# Patient Record
Sex: Female | Born: 1965
Health system: Southern US, Community
[De-identification: ages and names within clinical notes are randomized; demographics above are authoritative.]

## PROBLEM LIST (undated history)

## (undated) DIAGNOSIS — F5101 Primary insomnia: Secondary | ICD-10-CM

## (undated) DIAGNOSIS — M7918 Myalgia, other site: Secondary | ICD-10-CM

## (undated) DIAGNOSIS — M19041 Primary osteoarthritis, right hand: Secondary | ICD-10-CM

## (undated) DIAGNOSIS — J069 Acute upper respiratory infection, unspecified: Secondary | ICD-10-CM

## (undated) DIAGNOSIS — R5383 Other fatigue: Secondary | ICD-10-CM

## (undated) DIAGNOSIS — F339 Major depressive disorder, recurrent, unspecified: Secondary | ICD-10-CM

## (undated) DIAGNOSIS — M159 Polyosteoarthritis, unspecified: Secondary | ICD-10-CM

## (undated) DIAGNOSIS — J309 Allergic rhinitis, unspecified: Secondary | ICD-10-CM

## (undated) DIAGNOSIS — M19042 Primary osteoarthritis, left hand: Secondary | ICD-10-CM

## (undated) DIAGNOSIS — M19071 Primary osteoarthritis, right ankle and foot: Secondary | ICD-10-CM

## (undated) DIAGNOSIS — F32A Depression, unspecified: Secondary | ICD-10-CM

## (undated) DIAGNOSIS — F988 Other specified behavioral and emotional disorders with onset usually occurring in childhood and adolescence: Secondary | ICD-10-CM

## (undated) DIAGNOSIS — M1990 Unspecified osteoarthritis, unspecified site: Secondary | ICD-10-CM

## (undated) DIAGNOSIS — R112 Nausea with vomiting, unspecified: Secondary | ICD-10-CM

## (undated) DIAGNOSIS — F329 Major depressive disorder, single episode, unspecified: Secondary | ICD-10-CM

## (undated) DIAGNOSIS — M19072 Primary osteoarthritis, left ankle and foot: Secondary | ICD-10-CM

## (undated) DIAGNOSIS — F411 Generalized anxiety disorder: Secondary | ICD-10-CM

## (undated) DIAGNOSIS — G43009 Migraine without aura, not intractable, without status migrainosus: Secondary | ICD-10-CM

## (undated) HISTORY — PX: OTHER SURGICAL HISTORY: SHX169

## (undated) HISTORY — DX: Primary osteoarthritis, left hand: M19.042

## (undated) HISTORY — DX: Acute upper respiratory infection, unspecified: J06.9

## (undated) HISTORY — DX: Major depressive disorder, recurrent, unspecified: F33.9

## (undated) HISTORY — DX: Myalgia, other site: M79.18

## (undated) HISTORY — DX: Nausea with vomiting, unspecified: R11.2

## (undated) HISTORY — DX: Major depressive disorder, single episode, unspecified: F32.9

## (undated) HISTORY — DX: Generalized anxiety disorder: F41.1

## (undated) HISTORY — DX: Other fatigue: R53.83

## (undated) HISTORY — PX: DIAGNOSTIC LAPAROSCOPY: SUR761

## (undated) HISTORY — DX: Other specified behavioral and emotional disorders with onset usually occurring in childhood and adolescence: F98.8

## (undated) HISTORY — DX: Polyosteoarthritis, unspecified: M15.9

## (undated) HISTORY — DX: Allergic rhinitis, unspecified: J30.9

## (undated) HISTORY — DX: Primary osteoarthritis, right ankle and foot: M19.071

## (undated) HISTORY — DX: Migraine without aura, not intractable, without status migrainosus: G43.009

## (undated) HISTORY — DX: Primary insomnia: F51.01

## (undated) HISTORY — DX: Depression, unspecified: F32.A

## (undated) HISTORY — DX: Primary osteoarthritis, left ankle and foot: M19.072

## (undated) HISTORY — DX: Primary osteoarthritis, right hand: M19.041

---

## 1998-01-25 ENCOUNTER — Other Ambulatory Visit: Admission: RE | Admit: 1998-01-25 | Discharge: 1998-01-25 | Payer: Self-pay | Admitting: Obstetrics and Gynecology

## 1998-12-12 ENCOUNTER — Other Ambulatory Visit: Admission: RE | Admit: 1998-12-12 | Discharge: 1998-12-12 | Payer: Self-pay | Admitting: Obstetrics and Gynecology

## 1998-12-22 ENCOUNTER — Encounter: Payer: Self-pay | Admitting: Obstetrics and Gynecology

## 1998-12-22 ENCOUNTER — Ambulatory Visit (HOSPITAL_COMMUNITY): Admission: RE | Admit: 1998-12-22 | Discharge: 1998-12-22 | Payer: Self-pay | Admitting: Obstetrics and Gynecology

## 1999-11-02 ENCOUNTER — Ambulatory Visit (HOSPITAL_COMMUNITY): Admission: RE | Admit: 1999-11-02 | Discharge: 1999-11-02 | Payer: Self-pay | Admitting: *Deleted

## 1999-11-02 ENCOUNTER — Encounter: Payer: Self-pay | Admitting: *Deleted

## 1999-11-05 ENCOUNTER — Inpatient Hospital Stay: Admission: AD | Admit: 1999-11-05 | Discharge: 1999-11-05 | Payer: Self-pay | Admitting: Obstetrics

## 1999-11-05 ENCOUNTER — Encounter: Payer: Self-pay | Admitting: Obstetrics

## 1999-11-13 ENCOUNTER — Encounter (HOSPITAL_COMMUNITY): Admission: RE | Admit: 1999-11-13 | Discharge: 2000-02-11 | Payer: Self-pay | Admitting: *Deleted

## 1999-12-18 ENCOUNTER — Encounter: Payer: Self-pay | Admitting: *Deleted

## 1999-12-28 ENCOUNTER — Inpatient Hospital Stay (HOSPITAL_COMMUNITY): Admission: AD | Admit: 1999-12-28 | Discharge: 1999-12-28 | Payer: Self-pay | Admitting: *Deleted

## 2000-02-12 ENCOUNTER — Encounter (HOSPITAL_COMMUNITY): Admission: RE | Admit: 2000-02-12 | Discharge: 2000-04-21 | Payer: Self-pay | Admitting: *Deleted

## 2000-02-26 ENCOUNTER — Encounter: Payer: Self-pay | Admitting: *Deleted

## 2000-03-25 ENCOUNTER — Encounter: Payer: Self-pay | Admitting: *Deleted

## 2000-04-07 ENCOUNTER — Inpatient Hospital Stay (HOSPITAL_COMMUNITY): Admission: AD | Admit: 2000-04-07 | Discharge: 2000-04-07 | Payer: Self-pay | Admitting: *Deleted

## 2000-04-15 ENCOUNTER — Encounter: Payer: Self-pay | Admitting: *Deleted

## 2000-04-15 ENCOUNTER — Inpatient Hospital Stay (HOSPITAL_COMMUNITY): Admission: AD | Admit: 2000-04-15 | Discharge: 2000-04-15 | Payer: Self-pay | Admitting: *Deleted

## 2000-04-17 ENCOUNTER — Inpatient Hospital Stay (HOSPITAL_COMMUNITY): Admission: AD | Admit: 2000-04-17 | Discharge: 2000-04-17 | Payer: Self-pay

## 2000-04-18 ENCOUNTER — Encounter: Payer: Self-pay | Admitting: *Deleted

## 2000-04-18 ENCOUNTER — Encounter (INDEPENDENT_AMBULATORY_CARE_PROVIDER_SITE_OTHER): Payer: Self-pay | Admitting: Specialist

## 2000-04-18 ENCOUNTER — Inpatient Hospital Stay (HOSPITAL_COMMUNITY): Admission: AD | Admit: 2000-04-18 | Discharge: 2000-04-22 | Payer: Self-pay | Admitting: *Deleted

## 2000-04-19 ENCOUNTER — Encounter: Payer: Self-pay | Admitting: Obstetrics and Gynecology

## 2000-04-23 ENCOUNTER — Encounter: Admission: RE | Admit: 2000-04-23 | Discharge: 2000-05-26 | Payer: Self-pay | Admitting: *Deleted

## 2000-06-06 ENCOUNTER — Encounter (INDEPENDENT_AMBULATORY_CARE_PROVIDER_SITE_OTHER): Payer: Self-pay | Admitting: *Deleted

## 2000-06-06 ENCOUNTER — Inpatient Hospital Stay (HOSPITAL_COMMUNITY): Admission: AD | Admit: 2000-06-06 | Discharge: 2000-06-06 | Payer: Self-pay | Admitting: *Deleted

## 2002-02-01 ENCOUNTER — Other Ambulatory Visit: Admission: RE | Admit: 2002-02-01 | Discharge: 2002-02-01 | Payer: Self-pay | Admitting: Obstetrics & Gynecology

## 2002-11-03 ENCOUNTER — Ambulatory Visit (HOSPITAL_COMMUNITY): Admission: RE | Admit: 2002-11-03 | Discharge: 2002-11-03 | Payer: Self-pay | Admitting: Gastroenterology

## 2003-02-10 ENCOUNTER — Other Ambulatory Visit: Admission: RE | Admit: 2003-02-10 | Discharge: 2003-02-10 | Payer: Self-pay | Admitting: Obstetrics & Gynecology

## 2004-06-28 ENCOUNTER — Ambulatory Visit: Payer: Self-pay | Admitting: Family Medicine

## 2004-07-05 ENCOUNTER — Ambulatory Visit: Payer: Self-pay | Admitting: Family Medicine

## 2004-11-24 ENCOUNTER — Emergency Department (HOSPITAL_COMMUNITY): Admission: EM | Admit: 2004-11-24 | Discharge: 2004-11-24 | Payer: Self-pay | Admitting: Family Medicine

## 2005-06-11 ENCOUNTER — Ambulatory Visit: Payer: Self-pay | Admitting: Family Medicine

## 2005-09-05 ENCOUNTER — Ambulatory Visit: Payer: Self-pay | Admitting: Family Medicine

## 2005-09-12 ENCOUNTER — Ambulatory Visit: Payer: Self-pay | Admitting: Family Medicine

## 2005-11-18 ENCOUNTER — Encounter: Admission: RE | Admit: 2005-11-18 | Discharge: 2005-11-18 | Payer: Self-pay | Admitting: Family Medicine

## 2006-12-16 ENCOUNTER — Encounter: Admission: RE | Admit: 2006-12-16 | Discharge: 2006-12-16 | Payer: Self-pay | Admitting: Family Medicine

## 2006-12-17 DIAGNOSIS — F411 Generalized anxiety disorder: Secondary | ICD-10-CM

## 2006-12-17 DIAGNOSIS — J301 Allergic rhinitis due to pollen: Secondary | ICD-10-CM | POA: Insufficient documentation

## 2006-12-17 DIAGNOSIS — J309 Allergic rhinitis, unspecified: Secondary | ICD-10-CM | POA: Insufficient documentation

## 2006-12-17 HISTORY — DX: Allergic rhinitis, unspecified: J30.9

## 2006-12-17 HISTORY — DX: Generalized anxiety disorder: F41.1

## 2007-03-09 ENCOUNTER — Ambulatory Visit: Payer: Self-pay | Admitting: Family Medicine

## 2007-05-12 ENCOUNTER — Ambulatory Visit: Payer: Self-pay | Admitting: Family Medicine

## 2007-05-12 LAB — CONVERTED CEMR LAB
AST: 21 units/L (ref 0–37)
Albumin: 4 g/dL (ref 3.5–5.2)
Basophils Relative: 0.4 % (ref 0.0–1.0)
CO2: 30 meq/L (ref 19–32)
Chloride: 104 meq/L (ref 96–112)
Creatinine, Ser: 0.7 mg/dL (ref 0.4–1.2)
Eosinophils Relative: 3.3 % (ref 0.0–5.0)
Glucose, Bld: 89 mg/dL (ref 70–99)
Glucose, Urine, Semiquant: 100
HCT: 37.4 % (ref 36.0–46.0)
Hemoglobin: 12.6 g/dL (ref 12.0–15.0)
LDL Cholesterol: 127 mg/dL — ABNORMAL HIGH (ref 0–99)
Neutrophils Relative %: 42.3 % — ABNORMAL LOW (ref 43.0–77.0)
Protein, U semiquant: NEGATIVE
RBC: 4.15 M/uL (ref 3.87–5.11)
RDW: 11.4 % — ABNORMAL LOW (ref 11.5–14.6)
Sodium: 141 meq/L (ref 135–145)
TSH: 0.74 microintl units/mL (ref 0.35–5.50)
Total Bilirubin: 0.7 mg/dL (ref 0.3–1.2)
Total CHOL/HDL Ratio: 4.8
Total Protein: 6.7 g/dL (ref 6.0–8.3)
Urobilinogen, UA: 0.2
WBC Urine, dipstick: NEGATIVE
WBC: 4.6 10*3/uL (ref 4.5–10.5)

## 2007-05-18 ENCOUNTER — Ambulatory Visit: Payer: Self-pay | Admitting: Family Medicine

## 2007-05-18 DIAGNOSIS — F988 Other specified behavioral and emotional disorders with onset usually occurring in childhood and adolescence: Secondary | ICD-10-CM

## 2007-05-18 HISTORY — DX: Other specified behavioral and emotional disorders with onset usually occurring in childhood and adolescence: F98.8

## 2007-07-16 ENCOUNTER — Telehealth: Payer: Self-pay | Admitting: Family Medicine

## 2007-07-20 ENCOUNTER — Telehealth: Payer: Self-pay | Admitting: Family Medicine

## 2007-08-25 ENCOUNTER — Telehealth: Payer: Self-pay | Admitting: *Deleted

## 2007-12-29 ENCOUNTER — Encounter: Admission: RE | Admit: 2007-12-29 | Discharge: 2007-12-29 | Payer: Self-pay | Admitting: Family Medicine

## 2008-01-13 ENCOUNTER — Telehealth: Payer: Self-pay | Admitting: Family Medicine

## 2008-02-09 ENCOUNTER — Emergency Department (HOSPITAL_BASED_OUTPATIENT_CLINIC_OR_DEPARTMENT_OTHER): Admission: EM | Admit: 2008-02-09 | Discharge: 2008-02-09 | Payer: Self-pay | Admitting: Emergency Medicine

## 2008-02-28 DIAGNOSIS — R112 Nausea with vomiting, unspecified: Secondary | ICD-10-CM

## 2008-02-28 HISTORY — DX: Nausea with vomiting, unspecified: R11.2

## 2008-02-29 ENCOUNTER — Telehealth: Payer: Self-pay | Admitting: Family Medicine

## 2008-03-01 ENCOUNTER — Ambulatory Visit: Payer: Self-pay | Admitting: Family Medicine

## 2008-12-19 ENCOUNTER — Ambulatory Visit: Payer: Self-pay | Admitting: Family Medicine

## 2008-12-19 LAB — CONVERTED CEMR LAB
ALT: 23 units/L (ref 0–35)
AST: 21 units/L (ref 0–37)
Albumin: 4.2 g/dL (ref 3.5–5.2)
Alkaline Phosphatase: 51 units/L (ref 39–117)
BUN: 9 mg/dL (ref 6–23)
Basophils Absolute: 0 10*3/uL (ref 0.0–0.1)
Basophils Relative: 0.8 % (ref 0.0–3.0)
Bilirubin, Direct: 0 mg/dL (ref 0.0–0.3)
Blood in Urine, dipstick: NEGATIVE
CO2: 28 meq/L (ref 19–32)
Calcium: 9 mg/dL (ref 8.4–10.5)
Chloride: 108 meq/L (ref 96–112)
Cholesterol: 167 mg/dL (ref 0–200)
Creatinine, Ser: 0.6 mg/dL (ref 0.4–1.2)
Eosinophils Absolute: 0 10*3/uL (ref 0.0–0.7)
Eosinophils Relative: 1.1 % (ref 0.0–5.0)
GFR calc non Af Amer: 116.38 mL/min (ref >60)
Glucose, Bld: 92 mg/dL (ref 70–99)
HCT: 38.1 % (ref 36.0–46.0)
HDL: 49.6 mg/dL (ref >39.00)
Hemoglobin: 13 g/dL (ref 12.0–15.0)
LDL Cholesterol: 103 mg/dL — ABNORMAL HIGH (ref 0–99)
Lymphocytes Relative: 31.7 % (ref 12.0–46.0)
Lymphs Abs: 1.3 10*3/uL (ref 0.7–4.0)
MCHC: 34.1 g/dL (ref 30.0–36.0)
MCV: 94.6 fL (ref 78.0–100.0)
Monocytes Absolute: 0.4 10*3/uL (ref 0.1–1.0)
Monocytes Relative: 9.2 % (ref 3.0–12.0)
Neutro Abs: 2.5 10*3/uL (ref 1.4–7.7)
Neutrophils Relative %: 57.2 % (ref 43.0–77.0)
Nitrite: NEGATIVE
Platelets: 207 10*3/uL (ref 150.0–400.0)
Potassium: 3.6 meq/L (ref 3.5–5.1)
RBC: 4.02 M/uL (ref 3.87–5.11)
RDW: 12.1 % (ref 11.5–14.6)
Sodium: 141 meq/L (ref 135–145)
Specific Gravity, Urine: 1.025
TSH: 0.71 microintl units/mL (ref 0.35–5.50)
Total Bilirubin: 0.9 mg/dL (ref 0.3–1.2)
Total CHOL/HDL Ratio: 3
Total Protein: 7.4 g/dL (ref 6.0–8.3)
Triglycerides: 74 mg/dL (ref 0.0–149.0)
VLDL: 14.8 mg/dL (ref 0.0–40.0)
WBC Urine, dipstick: NEGATIVE
WBC: 4.2 10*3/uL — ABNORMAL LOW (ref 4.5–10.5)

## 2008-12-29 ENCOUNTER — Ambulatory Visit: Payer: Self-pay | Admitting: Family Medicine

## 2008-12-29 DIAGNOSIS — M159 Polyosteoarthritis, unspecified: Secondary | ICD-10-CM

## 2008-12-29 HISTORY — DX: Polyosteoarthritis, unspecified: M15.9

## 2010-02-16 ENCOUNTER — Telehealth: Payer: Self-pay | Admitting: Family Medicine

## 2010-03-29 ENCOUNTER — Ambulatory Visit (HOSPITAL_COMMUNITY): Payer: Self-pay | Admitting: Psychiatry

## 2010-04-17 ENCOUNTER — Encounter: Payer: Self-pay | Admitting: Family Medicine

## 2010-04-17 ENCOUNTER — Ambulatory Visit
Admission: RE | Admit: 2010-04-17 | Discharge: 2010-04-17 | Payer: Self-pay | Source: Home / Self Care | Attending: Family Medicine | Admitting: Family Medicine

## 2010-04-17 ENCOUNTER — Other Ambulatory Visit: Payer: Self-pay | Admitting: Family Medicine

## 2010-04-17 LAB — CBC WITH DIFFERENTIAL/PLATELET
Basophils Absolute: 0 10*3/uL (ref 0.0–0.1)
Basophils Relative: 0.6 % (ref 0.0–3.0)
Eosinophils Absolute: 0.2 10*3/uL (ref 0.0–0.7)
Eosinophils Relative: 4.1 % (ref 0.0–5.0)
HCT: 39.2 % (ref 36.0–46.0)
Hemoglobin: 13.3 g/dL (ref 12.0–15.0)
Lymphocytes Relative: 32.8 % (ref 12.0–46.0)
Lymphs Abs: 1.6 10*3/uL (ref 0.7–4.0)
MCHC: 34 g/dL (ref 30.0–36.0)
MCV: 94.5 fl (ref 78.0–100.0)
Monocytes Absolute: 0.4 10*3/uL (ref 0.1–1.0)
Monocytes Relative: 9.4 % (ref 3.0–12.0)
Neutro Abs: 2.5 10*3/uL (ref 1.4–7.7)
Neutrophils Relative %: 53.1 % (ref 43.0–77.0)
Platelets: 277 10*3/uL (ref 150.0–400.0)
RBC: 4.14 Mil/uL (ref 3.87–5.11)
RDW: 12.5 % (ref 11.5–14.6)
WBC: 4.8 10*3/uL (ref 4.5–10.5)

## 2010-04-17 LAB — CONVERTED CEMR LAB
Ketones, urine, test strip: NEGATIVE
Nitrite: NEGATIVE
Specific Gravity, Urine: 1.02
Urobilinogen, UA: 0.2

## 2010-04-17 LAB — BASIC METABOLIC PANEL
BUN: 12 mg/dL (ref 6–23)
CO2: 31 mEq/L (ref 19–32)
Calcium: 9.2 mg/dL (ref 8.4–10.5)
Chloride: 105 mEq/L (ref 96–112)
Creatinine, Ser: 0.5 mg/dL (ref 0.4–1.2)
GFR: 149.62 mL/min (ref >60.00)
Glucose, Bld: 90 mg/dL (ref 70–99)
Potassium: 4.2 mEq/L (ref 3.5–5.1)
Sodium: 141 mEq/L (ref 135–145)

## 2010-04-17 LAB — HEPATIC FUNCTION PANEL
ALT: 14 U/L (ref 0–35)
AST: 20 U/L (ref 0–37)
Albumin: 3.9 g/dL (ref 3.5–5.2)
Alkaline Phosphatase: 53 U/L (ref 39–117)
Bilirubin, Direct: 0.1 mg/dL (ref 0.0–0.3)
Total Bilirubin: 0.6 mg/dL (ref 0.3–1.2)
Total Protein: 7 g/dL (ref 6.0–8.3)

## 2010-04-17 LAB — LIPID PANEL
Cholesterol: 196 mg/dL (ref 0–200)
HDL: 49.3 mg/dL (ref >39.00)
LDL Cholesterol: 132 mg/dL — ABNORMAL HIGH (ref 0–99)
Total CHOL/HDL Ratio: 4
Triglycerides: 72 mg/dL (ref 0.0–149.0)
VLDL: 14.4 mg/dL (ref 0.0–40.0)

## 2010-04-17 LAB — TSH: TSH: 1.33 u[IU]/mL (ref 0.35–5.50)

## 2010-04-17 LAB — FOLATE: Folate: 6.1 ng/mL

## 2010-04-17 LAB — IRON: Iron: 64 ug/dL (ref 42–145)

## 2010-04-24 ENCOUNTER — Telehealth: Payer: Self-pay | Admitting: Family Medicine

## 2010-05-03 ENCOUNTER — Ambulatory Visit
Admission: RE | Admit: 2010-05-03 | Discharge: 2010-05-03 | Payer: Self-pay | Source: Home / Self Care | Attending: Family Medicine | Admitting: Family Medicine

## 2010-05-04 ENCOUNTER — Other Ambulatory Visit: Payer: Self-pay | Admitting: Family Medicine

## 2010-05-04 DIAGNOSIS — Z1239 Encounter for other screening for malignant neoplasm of breast: Secondary | ICD-10-CM

## 2010-05-07 ENCOUNTER — Telehealth: Payer: Self-pay | Admitting: Family Medicine

## 2010-05-08 NOTE — Progress Notes (Signed)
Summary: 30 day rx  Phone Note Call from Patient   Caller: Patient Call For: Roderick Pee MD Summary of Call: Pt will make ov asap must check work calendar first. Can pt get 30 day supply of lamotrigine 150mg  she is out. Pt is aware doc not in office until monday 02-19-10 Initial call taken by: Heron Sabins,  February 16, 2010 1:02 PM    Prescriptions: LAMICTAL 150 MG TABS (LAMOTRIGINE) once daily  #30 x 1   Entered by:   Kern Reap CMA (AAMA)   Authorized by:   Roderick Pee MD   Signed by:   Kern Reap CMA (AAMA) on 02/16/2010   Method used:   [facility]  [place] [address]       [address]       [place], [place]  [address]       Ph: [phone] or [phone]       Fax: [phone]   RxID:   [id number]

## 2010-05-10 NOTE — Progress Notes (Signed)
Summary: Pt req refill of Lamictal to last until cpx date on 05/03/10  Phone Note Refill Request Call back at [phone] cell Message from:  Patient on April 24, 2010 11:11 AM  Refills Requested: Medication #1:  LAMICTAL 150 MG TABS once daily.   Dosage confirmed as above?Dosage Confirmed Pt will run out of med before her sch cpx on 06/03/10. Pls call in refill to last until appt date.    Method [facility]ed[facility] [place] Next Appointment Scheduled: CPX on 06/03/10 Initial call taken by: Lucy Antigua,  April 24, 2010 11:12 AM    Prescriptions: LAMICTAL 150 MG TABS (LAMOTRIGINE) once daily  #30 x 1   Entered by:   Kern Reap CMA (AAMA)   Authorized by:   Roderick Pee MD   Signed by:   Kern Reap CMA (AAMA) on 04/24/2010   Method used:   [facility]        [facility]  [place] [address] [facility]       [address]       [place], [place]  [address]       Ph: [phone] or [phone]       Fax: [phone]   RxID:   [id number]

## 2010-05-10 NOTE — Assessment & Plan Note (Signed)
Summary: CPX // RS   Vital Signs:  Patient profile:   45 year old female Menstrual status:  regular Height:      66 inches Weight:      142 pounds BMI:     23.00 Temp:     98.7 degrees F oral BP sitting:   110 / 76  (left arm) Cuff size:   regular  Vitals Entered By: Kern Reap CMA Duncan Dull) (May 03, 2010 4:06 PM) CC: cpx   CC:  cpx.  History of Present Illness: Jacqueline Sloan is a 45 year old married female nonsmoker, who comes in today for general physical examination because of a history of depression on  lamictal  150 milligrams daily.  She feels depressed and wants to know, she should increase her medication.  She also has a history of ADD as been off medicine for a couple years, but now feels a problem with focus and concentration, and would like to restart her medication.  She has taken a half of a 36-mg Concerta that her twin children take for two weeks.  It made her feel much better.  She would like to continue the Concerta.  She states her twin children go to a psychiatrist because of underlying ADD and she has to come back once a month to get a prescription.  She states she does not using birth control.  She has difficulty getting pregnant.LMP every 5 weeks Pap by GYN  Review of systems otherwise negative  She also takes Midrin p.r.n. for migraines, however, as she gets older.  Her migraines become less frequent and less severe.  Allergies (verified): No Known Drug Allergies  Past History:  Past medical, surgical, family and social histories (including risk factors) reviewed, and no changes noted (except as noted below).  Past Medical History: Reviewed history from 05/18/2007 and no changes required. MHA Allergic rhinitis Anxiety ADD  Past Surgical History: CB x 1 ..twins 2001  Family History: Reviewed history from 03/09/2007 and no changes required. Family History of Arthritis Family History of Endometrial cancer Family History of Cardiovascular  disorder mother has CHF, and hypertension  Social History: Reviewed history from 03/09/2007 and no changes required. Never Smoked Alcohol use-no Married 44 year old twins  Review of Systems      See HPI  Physical Exam  General:  Well-developed,well-nourished,in no acute distress; alert,appropriate and cooperative throughout examination Head:  Normocephalic and atraumatic without obvious abnormalities. No apparent alopecia or balding. Eyes:  No corneal or conjunctival inflammation noted. EOMI. Perrla. Funduscopic exam benign, without hemorrhages, exudates or papilledema. Vision grossly normal. Ears:  External ear exam shows no significant lesions or deformities.  Otoscopic examination reveals clear canals, tympanic membranes are intact bilaterally without bulging, retraction, inflammation or discharge. Hearing is grossly normal bilaterally. Nose:  External nasal examination shows no deformity or inflammation. Nasal mucosa are pink and moist without lesions or exudates. Mouth:  Oral mucosa and oropharynx without lesions or exudates.  Teeth in good repair. Neck:  No deformities, masses, or tenderness noted. Chest Wall:  No deformities, masses, or tenderness noted. Breasts:  No mass, nodules, thickening, tenderness, bulging, retraction, inflamation, nipple discharge or skin changes noted.   Lungs:  Normal respiratory effort, chest expands symmetrically. Lungs are clear to auscultation, no crackles or wheezes. Heart:  Normal rate and regular rhythm. S1 and S2 normal without gallop, murmur, click, rub or other extra sounds. Abdomen:  Bowel sounds positive,abdomen soft and non-tender without masses, organomegaly or hernias noted. Msk:  No deformity or scoliosis noted  of thoracic or lumbar spine.   Pulses:  R and L carotid,radial,femoral,dorsalis pedis and posterior tibial pulses are full and equal bilaterally Extremities:  No clubbing, cyanosis, edema, or deformity noted with normal full range  of motion of all joints.   Neurologic:  No cranial nerve deficits noted. Station and gait are normal. Plantar reflexes are down-going bilaterally. DTRs are symmetrical throughout. Sensory, motor and coordinative functions appear intact. Skin:  Intact without suspicious lesions or rashes Cervical Nodes:  No lymphadenopathy noted Axillary Nodes:  No palpable lymphadenopathy Inguinal Nodes:  No significant adenopathy Psych:  Cognition and judgment appear intact. Alert and cooperative with normal attention span and concentration. No apparent delusions, illusions, hallucinations   Impression & Recommendations:  Problem # 1:  ADD (ICD-314.00) Assessment Deteriorated  Orders: Prescription Created Electronically [phone])  Problem # 2:  ANXIETY (ICD-300.00) Assessment: Deteriorated  Orders: Prescription Created Electronically [phone])  Problem # 3:  Preventive Health Care (ICD-V70.0) Assessment: Unchanged  Complete Medication List: 1)  Midrin 325-65-100 Mg Caps (Apap-isometheptene-dichloral) .... As needed for  migraines 2)  Lamictal Xr 300 Mg Xr24h-tab (Lamotrigine) .... Take 1 tablet by mouth every morning 3)  Concerta 18 Mg Cr-tabs (Methylphenidate hcl) .... Take 1 tablet by mouth every morning  Other Orders: Tdap => 36yrs IM ([address]782) Admin 1st Vaccine (95621)  Patient Instructions: 1)  increase the lamictal to 300 mg daily,,,,,,,,,call in 3 weeks to let us know how u  are doing. 2)  Begin Concerta 18 mg q.a.m. call in 3 weeks to let us know how u are  doing. 3)  Return in one year, sooner if any problems Prescriptions: CONCERTA 18 MG CR-TABS (METHYLPHENIDATE HCL) Take 1 tablet by mouth every morning  #30 x 0   Entered and Authorized by:   Roderick Pee MD   Signed by:   Roderick Pee MD on 05/03/2010   Method used:   Print then Give to Patient   RxID:   [id number] LAMICTAL XR 300 MG XR24H-TAB (LAMOTRIGINE) Take 1 tablet by mouth every morning  #30 x 0   Entered and  Authorized by:   Roderick Pee MD   Signed by:   Roderick Pee MD on 05/03/2010   Method used:   [facility]  [place] [address] [facility]       [address]       [place], [place]  [address]       Ph: [phone] or [phone]       Fax: [phone]   RxID:   [id number]    Orders Added: 1)  Tdap => 11yrs IM [90715] 2)  Admin 1st Vaccine [90471] 3)  Prescription Created Electronically [[id number]] 4)  Est. Patient 40-64 years [60109]   Immunizations Administered:  Tetanus Vaccine:    Vaccine Type: Tdap    Site: right deltoid    Mfr: GlaxoSmithKline    Dose: 0.5 ml    Route: IM    Given by: Kern Reap CMA (AAMA)    Exp. Date: 01/26/2012    Lot #: [id number]    VIS given: 02/24/08 version given May 03, 2010.    Physician counseled: yes   Immunizations Administered:  Tetanus Vaccine:    Vaccine Type: Tdap    Site: right deltoid    Mfr: GlaxoSmithKline    Dose: 0.5 ml    Route: IM    Given by: Kern Reap CMA (AAMA)    Exp.  Date: 01/26/2012    Lot #: [id number]    VIS given: 02/24/08 version given May 03, 2010.    Physician counseled: yes

## 2010-05-16 NOTE — Progress Notes (Signed)
Summary: zomig rx  Phone Note From Pharmacy   Summary of Call: midrin is no longer available  Follow-up for Phone Call        zomig 2.5 per dr todd Follow-up by: Kern Reap CMA Duncan Dull),  May 07, 2010 4:47 PM    New/Updated Medications: ZOMIG 2.5 MG TABS (ZOLMITRIPTAN) one tab at onset of migraine Prescriptions: ZOMIG 2.5 MG TABS (ZOLMITRIPTAN) one tab at onset of migraine  #6 x 11   Entered by:   Kern Reap CMA (AAMA)   Authorized by:   Roderick Pee MD   Signed by:   Kern Reap CMA (AAMA) on 05/07/2010   Method used:   [facility]  [place] [address] [facility]       [address]       [place], [place]  [address]       Ph: [phone] or [phone]       Fax: [phone]   RxID:   [id number]

## 2010-05-30 ENCOUNTER — Ambulatory Visit
Admission: RE | Admit: 2010-05-30 | Discharge: 2010-05-30 | Disposition: A | Discharge status: Home / Self Care | Payer: Federal, State, Local not specified - PPO | Source: Ambulatory Visit | Attending: Family Medicine | Admitting: Family Medicine

## 2010-05-30 DIAGNOSIS — Z1239 Encounter for other screening for malignant neoplasm of breast: Secondary | ICD-10-CM

## 2010-06-18 ENCOUNTER — Telehealth: Payer: Self-pay | Admitting: Family Medicine

## 2010-06-18 NOTE — Telephone Encounter (Signed)
Pt called and has lost written scripts for Concerta 18 mg. Pls call pt when ready for pick up.

## 2010-06-19 MED ORDER — METHYLPHENIDATE HCL ER (OSM) 18 MG PO TBCR: 18.0000 mg | EXTENDED_RELEASE_TABLET | ORAL | Status: DC

## 2010-06-19 NOTE — Telephone Encounter (Signed)
Please rewrite Concerta, x 3 months

## 2010-06-19 NOTE — Telephone Encounter (Signed)
Left message on machine rx ready for pick up  

## 2010-06-19 NOTE — Telephone Encounter (Signed)
Pt callback concerning status of concerta. Pt is aware rx not ready

## 2010-06-22 ENCOUNTER — Other Ambulatory Visit: Payer: Self-pay | Admitting: *Deleted

## 2010-06-22 MED ORDER — LAMOTRIGINE 150 MG PO TABS: 150.0000 mg | ORAL_TABLET | Freq: Two times a day (BID) | ORAL | Status: DC

## 2010-06-22 NOTE — Telephone Encounter (Signed)
patient  Would like generic lamictal

## 2010-08-24 NOTE — Discharge Summary (Signed)
[facility]  Patient:    ERNIE, SAGRERO                 MRN: [id number] Adm. Date:  [id number] Disc. Date: [id number] Attending:  Michaelle Copas                           Discharge Summary  HISTORY OF PRESENT ILLNESS:   A 45 year old gravida 1, para 0 at 2 2/[redacted] weeks gestation.  Patient had spontaneous rupture of membranes at 8:00 in the morning.  Patient went into spontaneous labor.  Patient conceived due to infertility assessment and treatment.  Patients labor progressed appropriately and patient delivered twin A 5 pound 11 ounce female Apgars of 7/8 spontaneously.  Twin B presented in a vertex presentation.  Fluid was ruptured.  Surgeon ______.  Vertex descended to a -1/0 station; however, there was persistent prolonged fetal bradycardia with no response to scalp stimulation and the fetal head was too high for operative vaginal delivery and patient had a STAT cesarean birth of twin B female with cord pH of 6.9.  PCO2 109.  Of interest, the fetal umbilical cord was hyperspiraled.  Placenta was sent to pathology.  Patient had appropriate postoperative course and regained normal bowel and bladder function without difficulty and was felt to be ready for discharge on April 22, 2000.  Patient is to follow-up in four to six weeks to see me. DD:  07/16/00 TD:  07/16/00 Job: [id number]

## 2010-08-24 NOTE — Op Note (Signed)
Jacqueline Sloan, Jacqueline Sloan                    ACCOUNT NO.:  [id number]   MEDICAL RECORD NO.:  [id number]                   PATIENT TYPE:  AMB   LOCATION:  ENDO                                 FACILITY:  [facility]   PHYSICIAN:  Anselmo Rod, M.D.               DATE OF BIRTH:  [birth year]   DATE OF PROCEDURE:  11/03/2002  DATE OF DISCHARGE:                                 OPERATIVE REPORT   PROCEDURE PERFORMED:  Screening colonoscopy.   ENDOSCOPIST:  Charna Elizabeth, M.D.   INSTRUMENT USED:  Olympus video colonoscope.   INDICATIONS FOR PROCEDURE:  The patient is a 45 year old white female with a  family history of colon cancer in her paternal grandfather.  History of  rectal bleeding and change in her bowel habits with loose stools for the  last four months. Rule out colonic polyps, masses, inflammatory bowel  disease, etc.   PREPROCEDURE PREPARATION:  Informed consent was procured from the patient.  The patient was fasted for eight hours prior to the procedure and prepped  with a bottle of magnesium citrate and a gallon of GoLYTELY the night prior  to the procedure.   PREPROCEDURE PHYSICAL:  The patient had stable vital signs.  Neck supple.  Chest clear to auscultation.  S1 and S2 regular.  Abdomen soft with normal  bowel sounds.   DESCRIPTION OF PROCEDURE:  The patient was placed in left lateral decubitus  position and sedated with 50 mg of Demerol and 5 mg of Versed intravenously.  Once the patient was adequately sedated and maintained on low flow oxygen  and continuous cardiac monitoring, the Olympus video colonoscope was  advanced from the rectum to the cecum and terminal ileum without difficulty.  No masses, polyps, erosions, ulcerations or diverticula were seen.  The  appendicular orifice and ileocecal valve were clearly visualized and  photographed.  The terminal ileum appeared healthy and without lesions.  Retroflexion in the rectum revealed small nonbleeding internal  hemorrhoids.  The patient tolerated the procedure well without complication.   IMPRESSION:  Normal colonoscopy up to the terminal ileum except for small  nonbleeding internal hemorrhoids seen on retroflexion in the rectum.   RECOMMENDATIONS:  1. Repeat colorectal cancer screening is recommended in the next five years     considering the patient's family history of colon cancer.  2. A high fiber diet with liberal fluid intake has been advocated.  3. The patient is to return back to the office on a p.r.n. basis if she     develops any recurrence of her symptoms in the near future.  She tells me     that her diarrhea has improved in the recent past with a high fiber diet.  Anselmo Rod, M.D.    JNM/MEDQ  D:  11/03/2002  T:  11/03/2002  Job:  [id number]   cc:   Pollyann Savoy, M.D.  [address]  [place], [place] [address]  Fax: [phone]   Eugenio Hoes. Tawanna Cooler, M.D. [facility]

## 2010-08-24 NOTE — Op Note (Signed)
[facility]  Patient:    Jacqueline Sloan, Jacqueline Sloan                 MRN: [id number] Proc. Date: 04/18/00 Adm. Date:  [id number] Disc. Date: [id number] Attending:  Antionette Char                           Operative Report  PREOPERATIVE DIAGNOSIS:         Intrauterine pregnancy at 35-2/[redacted] weeks gestation, twins with spontaneous rupture of membranes and persistent fetal bradycardia of twin B.  POSTOPERATIVE DIAGNOSES:        1. Intrauterine pregnancy at 35-2/[redacted] weeks                                    gestation, twins with spontaneous                                    rupture of membranes and persistent fetal                                    bradycardia of twin B.                                 2. Hyperspiral umbilical cord for twin B.  OPERATION:  SURGEON:                        Conni Elliot, M.D.  OPERATIVE FINDINGS:             Normal spontaneous vaginal delivery of twin A, 5 pound 11 ounce female with Apgars of 7 and 8.  Following delivery of twin A, vertex confirmed by exam and confirmed by ultrasound.  Artificial rupture of membranes clear from -1 station, well-applied.  Fluid was clear.  Point of descent was 0 station.  There was persistent prolonged fetal bradycardia with no response to the scalp stimulation.  Vertex to remained too high station to consider vacuum and we proceeded with stat low transverse cesarean delivery.  DESCRIPTION OF PROCEDURE:       After placing the patient originally under operative level of continuous lumbar epidural, the patient in the supine position, receiving oxygen, she was quickly prepped and draped in a sterile fashion.  A low transverse Pfannenstiel incision was made and carried down through the skin and subcutaneous fascia.  The pneumoperitoneum cavity was entered.  Bladder flap was created and a low transverse uterine incision was made.  The infant was delivered in vertex presentation.  Cord doubly  clamped and cut and baby handed to the neonatologist in attendance.  The placenta was delivered spontaneously.  The uterus, bladder flap, anterior peritoneum, fascia and skin closed in routine fashion.  Estimated blood loss was approximately 1000 cc.  Needle and sponge count were not done prior to procedure, therefore, an x-ray was taken postoperatively and there was found to be no retained material. DD:  04/19/00 TD:  04/19/00 Job: [id number]

## 2010-09-26 ENCOUNTER — Other Ambulatory Visit: Payer: Self-pay | Admitting: Family Medicine

## 2010-09-26 NOTE — Telephone Encounter (Signed)
Pt called req refill of Concerta 18 mg. Pt req to pick up by Friday.

## 2010-09-27 MED ORDER — METHYLPHENIDATE HCL ER (OSM) 18 MG PO TBCR: 18.0000 mg | EXTENDED_RELEASE_TABLET | Freq: Every day | ORAL | Status: DC

## 2010-09-27 NOTE — Telephone Encounter (Signed)
Left message on machine rx ready for pick up  

## 2011-01-22 ENCOUNTER — Other Ambulatory Visit: Payer: Self-pay | Admitting: Obstetrics and Gynecology

## 2011-01-22 DIAGNOSIS — N6320 Unspecified lump in the left breast, unspecified quadrant: Secondary | ICD-10-CM

## 2011-02-04 ENCOUNTER — Ambulatory Visit
Admission: RE | Admit: 2011-02-04 | Discharge: 2011-02-04 | Disposition: A | Discharge status: Home / Self Care | Payer: Federal, State, Local not specified - PPO | Source: Ambulatory Visit | Attending: Obstetrics and Gynecology | Admitting: Obstetrics and Gynecology

## 2011-02-04 DIAGNOSIS — N6320 Unspecified lump in the left breast, unspecified quadrant: Secondary | ICD-10-CM

## 2011-02-11 ENCOUNTER — Ambulatory Visit (INDEPENDENT_AMBULATORY_CARE_PROVIDER_SITE_OTHER): Payer: Federal, State, Local not specified - PPO | Admitting: Family Medicine

## 2011-02-11 ENCOUNTER — Encounter: Payer: Self-pay | Admitting: Family Medicine

## 2011-02-11 VITALS — BP 110/78 | Temp 98.8°F | Wt 142.0 lb

## 2011-02-11 DIAGNOSIS — J069 Acute upper respiratory infection, unspecified: Secondary | ICD-10-CM

## 2011-02-11 HISTORY — DX: Acute upper respiratory infection, unspecified: J06.9

## 2011-02-11 MED ORDER — HYDROCODONE-HOMATROPINE 5-1.5 MG/5ML PO SYRP: ORAL_SOLUTION | ORAL | Status: DC

## 2011-02-11 NOTE — Progress Notes (Signed)
  Subjective:    Patient ID: Jacqueline Sloan, female    DOB: 1966/04/05, 45 y.o.   MRN: [id number]  HPI J. Is a delightful, 45 year old, married female, nonsmoker, who comes in today with the viral syndrome for 10 days.  She said that condition.  Postnasal drip, fever, chills, cough, sore throat for about 10 days.  It seems to be getting better in the last 24 hours.  Review of systems otherwise negative   Review of Systems    General an infectious review of systems otherwise negative Objective:   Physical Exam Well-developed well-nourished, female in no acute distress.  Examination of the HEENT negative.  Neck was supple.  Thyroid is not enlarged.  No adenopathy.  Lungs are clear       Assessment & Plan:  Viral syndrome.  Plan treat symptomatically with Tylenol lots of liquids.  Hydromet cough syrup p.r.n.

## 2011-02-11 NOTE — Patient Instructions (Signed)
Drink lots of liquids.  Hydromet one half to 1 teaspoon at bedtime p.r.n. Cough.  Return p.r.n.

## 2011-02-15 ENCOUNTER — Other Ambulatory Visit: Payer: Self-pay | Admitting: Family Medicine

## 2011-03-21 ENCOUNTER — Telehealth: Payer: Self-pay | Admitting: Family Medicine

## 2011-03-21 MED ORDER — METHYLPHENIDATE HCL ER (OSM) 18 MG PO TBCR: 18.0000 mg | EXTENDED_RELEASE_TABLET | Freq: Every day | ORAL | Status: DC

## 2011-03-21 NOTE — Telephone Encounter (Signed)
Refill Concerta 18 mg

## 2011-03-21 NOTE — Telephone Encounter (Signed)
rx ready. Left message on machine for patient

## 2011-05-03 ENCOUNTER — Other Ambulatory Visit: Payer: Self-pay | Admitting: Family Medicine

## 2011-05-03 DIAGNOSIS — Z1231 Encounter for screening mammogram for malignant neoplasm of breast: Secondary | ICD-10-CM

## 2011-06-03 ENCOUNTER — Ambulatory Visit
Admission: RE | Admit: 2011-06-03 | Discharge: 2011-06-03 | Disposition: A | Discharge status: Home / Self Care | Payer: Federal, State, Local not specified - PPO | Source: Ambulatory Visit | Attending: Family Medicine | Admitting: Family Medicine

## 2011-06-03 DIAGNOSIS — Z1231 Encounter for screening mammogram for malignant neoplasm of breast: Secondary | ICD-10-CM

## 2011-07-04 ENCOUNTER — Telehealth: Payer: Self-pay | Admitting: Family Medicine

## 2011-07-04 MED ORDER — METHYLPHENIDATE HCL ER (OSM) 18 MG PO TBCR: 18.0000 mg | EXTENDED_RELEASE_TABLET | ORAL | Status: DC

## 2011-07-04 NOTE — Telephone Encounter (Signed)
rx ready for pick up and patient is aware  

## 2011-07-04 NOTE — Telephone Encounter (Signed)
Okay 

## 2011-07-04 NOTE — Telephone Encounter (Signed)
Okay to refill medicines she can come by today and pick up

## 2011-07-04 NOTE — Telephone Encounter (Signed)
Patient called stating that she would like a refill on her concerta which is not due until the 13th of April. Please advise.

## 2012-04-08 HISTORY — PX: TONSILECTOMY, ADENOIDECTOMY, BILATERAL MYRINGOTOMY AND TUBES: SHX2538

## 2012-06-01 ENCOUNTER — Other Ambulatory Visit: Payer: Self-pay | Admitting: Family Medicine

## 2012-06-01 DIAGNOSIS — Z1231 Encounter for screening mammogram for malignant neoplasm of breast: Secondary | ICD-10-CM

## 2012-06-15 ENCOUNTER — Ambulatory Visit: Payer: Federal, State, Local not specified - PPO

## 2012-06-19 ENCOUNTER — Ambulatory Visit
Admission: RE | Admit: 2012-06-19 | Discharge: 2012-06-19 | Disposition: A | Discharge status: Home / Self Care | Payer: Federal, State, Local not specified - PPO | Source: Ambulatory Visit | Attending: Family Medicine | Admitting: Family Medicine

## 2012-06-19 DIAGNOSIS — Z1231 Encounter for screening mammogram for malignant neoplasm of breast: Secondary | ICD-10-CM

## 2012-07-08 ENCOUNTER — Telehealth: Payer: Self-pay | Admitting: Family Medicine

## 2012-07-08 ENCOUNTER — Encounter: Payer: Self-pay | Admitting: Family Medicine

## 2012-07-08 ENCOUNTER — Ambulatory Visit (INDEPENDENT_AMBULATORY_CARE_PROVIDER_SITE_OTHER): Payer: Federal, State, Local not specified - PPO | Admitting: Family Medicine

## 2012-07-08 VITALS — BP 92/60 | HR 88 | Temp 99.1°F | Wt 146.0 lb

## 2012-07-08 DIAGNOSIS — R5381 Other malaise: Secondary | ICD-10-CM

## 2012-07-08 DIAGNOSIS — R5383 Other fatigue: Secondary | ICD-10-CM

## 2012-07-08 LAB — CBC WITH DIFFERENTIAL/PLATELET
Basophils Relative: 0.5 % (ref 0.0–3.0)
Eosinophils Relative: 5 % (ref 0.0–5.0)
HCT: 37.8 % (ref 36.0–46.0)
MCV: 92.5 fl (ref 78.0–100.0)
Monocytes Absolute: 0.4 10*3/uL (ref 0.1–1.0)
Monocytes Relative: 9.1 % (ref 3.0–12.0)
Neutrophils Relative %: 53.6 % (ref 43.0–77.0)
RBC: 4.09 Mil/uL (ref 3.87–5.11)
WBC: 3.9 10*3/uL — ABNORMAL LOW (ref 4.5–10.5)

## 2012-07-08 LAB — TSH: TSH: 1.02 u[IU]/mL (ref 0.35–5.50)

## 2012-07-08 LAB — COMPREHENSIVE METABOLIC PANEL
Albumin: 3.9 g/dL (ref 3.5–5.2)
Alkaline Phosphatase: 50 U/L (ref 39–117)
BUN: 10 mg/dL (ref 6–23)
CO2: 28 mEq/L (ref 19–32)
Calcium: 8.7 mg/dL (ref 8.4–10.5)
Chloride: 105 mEq/L (ref 96–112)
GFR: 108.22 mL/min (ref >60.00)
Glucose, Bld: 100 mg/dL — ABNORMAL HIGH (ref 70–99)
Potassium: 4.4 mEq/L (ref 3.5–5.1)

## 2012-07-08 LAB — HEMOGLOBIN A1C: Hgb A1c MFr Bld: 5.2 % (ref 4.6–6.5)

## 2012-07-08 NOTE — Telephone Encounter (Signed)
Labs look good. WBC very mildly low - our lab tends to have a lot of these. All other blood counts look great. Advise repeating at physical with PCP to ensure stable.

## 2012-07-08 NOTE — Telephone Encounter (Signed)
Left a message for pt to return call 

## 2012-07-08 NOTE — Telephone Encounter (Signed)
Called and spoke with pt and pt is aware.  

## 2012-07-08 NOTE — Progress Notes (Signed)
Chief Complaint  Patient presents with  . Foot Swelling    and sore all over x 1 month     HPI:  47 yo F pt of Dr. Barbette Or with PMH anxiety, depression, ADD, gen OA, migraine here for acute visit with complaint of feeling "like has a bug" and multiple other chronic complaints -has had sore throat, glands in neck swollen, nasal congestion and fatigue acutely and everyone at home has been fighting a bug -also has chronic pains all over the body and more in ankles - sees rheum for this and has had work up and told is OA - has appointment with Rheum to follow up on this -wants basic labs  -denies: fevers, weight loss, blood or mucus in stools, NVD  ROS: See pertinent positives and negatives per HPI.  Past Medical History  Diagnosis Date  . ADD 05/18/2007  . ALLERGIC RHINITIS 12/17/2006  . ANXIETY 12/17/2006  . GEN OSTEOARTHROSIS INVOLVING MULTIPLE SITES 12/29/2008  . Migraine     Family History  Problem Relation Age of Onset  . Heart disease Mother     HCF  . Hypertension Mother   . Arthritis Neg Hx     family hx  . Cancer Neg Hx     family hx endometrial ca    History   Social History  . Marital Status: Married    Spouse Name: N/A    Number of Children: N/A  . Years of Education: N/A   Social History Main Topics  . Smoking status: Never Smoker   . Smokeless tobacco: None  . Alcohol Use: No  . Drug Use:   . Sexually Active:    Other Topics Concern  . None   Social History Narrative  . None    Current outpatient prescriptions:lamoTRIgine (LAMICTAL) 150 MG tablet, TAKE 1 TABLET BY MOUTH TWICE A DAY, Disp: 100 tablet, Rfl: 3;  ZOLMitriptan (ZOMIG) 2.5 MG tablet, Take 2.5 mg by mouth as needed. At the on set of migraine  , Disp: , Rfl: ;  escitalopram (LEXAPRO) 20 MG tablet, , Disp: , Rfl: ;  HYDROcodone-homatropine (HYDROMET) 5-1.5 MG/5ML syrup, One half to 1 teaspoon at bedtime p.r.n. cough, Disp: 120 mL, Rfl: 2 methylphenidate (CONCERTA) 18 MG CR tablet, Take 1 tablet  (18 mg total) by mouth daily., Disp: 30 tablet, Rfl: 0;  methylphenidate (CONCERTA) 18 MG CR tablet, Take 1 tablet (18 mg total) by mouth daily., Disp: 30 tablet, Rfl: 0;  methylphenidate (CONCERTA) 18 MG CR tablet, Take 1 tablet (18 mg total) by mouth daily., Disp: 30 tablet, Rfl: 0 methylphenidate (CONCERTA) 18 MG CR tablet, Take 1 tablet (18 mg total) by mouth every morning., Disp: 30 tablet, Rfl: 0;  methylphenidate (CONCERTA) 18 MG CR tablet, Take 1 tablet (18 mg total) by mouth every morning., Disp: 30 tablet, Rfl: 0;  methylphenidate (CONCERTA) 18 MG CR tablet, Take 1 tablet (18 mg total) by mouth every morning., Disp: 30 tablet, Rfl: 0  EXAM:  Filed Vitals:   07/08/12 0804  BP: 92/60  Pulse: 88  Temp: 99.1 F (37.3 C)    Body mass index is 23.58 kg/(m^2).  GENERAL: vitals reviewed and listed above, alert, oriented, appears well hydrated and in no acute distress  HEENT: atraumatic, conjunttiva clear, no obvious abnormalities on inspection of external nose and ears, normal appearance of ear canals and TMs, clear nasal congestion, mild post oropharyngeal erythema with PND, no tonsillar edema or exudate, no sinus TTP  NECK: no obvious masses  on inspection  LUNGS: clear to auscultation bilaterally, no wheezes, rales or rhonchi, good air movement  CV: HRRR, no peripheral edema  MS: moves all extremities without noticeable abnormality  PSYCH: pleasant and cooperative, no obvious depression or anxiety  ASSESSMENT AND PLAN:  Discussed the following assessment and plan:  Fatigue - Plan: CBC with Differential, CMP, TSH, Hemoglobin A1c  -advised follow up with rheum is a good idea for her chronic pain issues -per exam URI likely viral/allergic and advised supportive care and restarting her allergy regimen -follow up physical with PCP in 1 month -basic labs drawn in anticipation of her physical per her request -Patient advised to return or notify a doctor immediately if symptoms  worsen or persist or new concerns arise.  There are no Patient Instructions on file for this visit.   Kriste Basque R.

## 2012-07-09 ENCOUNTER — Telehealth: Payer: Self-pay | Admitting: Family Medicine

## 2012-07-09 NOTE — Telephone Encounter (Signed)
Patient called stating that she would like to be referred to a Rheumatologist for the pain and swelling in her hands and feet. Please assist.

## 2012-07-10 ENCOUNTER — Other Ambulatory Visit: Payer: Self-pay | Admitting: Family Medicine

## 2012-07-10 NOTE — Telephone Encounter (Signed)
We did not discuss refilling her zomig at her appt. It appears has not been filled by her PCP in a long time and has potential serious interaction with her lexapro. I will forward to her PCP to see if he is ok with refilling.

## 2012-07-10 NOTE — Telephone Encounter (Signed)
Pls advise.  

## 2012-07-10 NOTE — Telephone Encounter (Signed)
Fleet Contras please call,,,,,,,,,, she can self refer to Dr. Kara Pacer ,,,,,, rheumatologist,,,,,, just tell her to use R. Name when she calls to make an appointment

## 2012-07-10 NOTE — Telephone Encounter (Signed)
Pt saw Dr Selena Batten yesterday. States Dr. Selena Batten was going to call in [facility] [place]. It was not done. Pt requesting rx. Please advise.

## 2012-07-16 NOTE — Telephone Encounter (Signed)
pls advise

## 2012-07-20 ENCOUNTER — Telehealth: Payer: Self-pay | Admitting: Family Medicine

## 2012-07-20 NOTE — Telephone Encounter (Signed)
This medication has been denied by Dr Tawanna Cooler.  Please have the patient schedule an appointment with Dr Tawanna Cooler for more refills.

## 2012-07-20 NOTE — Telephone Encounter (Signed)
Pt decline appt. Pt said she will callback if needed

## 2012-07-20 NOTE — Telephone Encounter (Signed)
[facility] Triage Call Report Triage Record Num: [id number] Operator: Thayer Headings Patient Name: Jacqueline Sloan Call Date & Time: 07/19/2012 11:58:19AM Patient Phone: [phone] PCP: Eugenio Hoes. Todd Patient Gender: Female PCP Fax : [phone] Patient DOB: 11-Jan-1966 [facility] Name: [facility] Reason for Call: Caller: [name]/Patient; PCP: Kriste Basque (Family Practice); CB#: [phone]; Calling today 07/19/12 regarding she was in office on 07/08/12 and said MD was supposed to call in her migraine medication but the pharmacy never received anything. She called back on 07/10/12 and requested the refill but she never heard back from office. Triage pulled chart and see following note from Kriste Basque on 07/10/12 at 2:58 PM, "We did not discuss refilling her zomig at her appt. It appears has not been filled by her PCP in a long time and has potential serious interaction with her lexapro. I will forward to her PCP to see if he is ok with refilling." No response noted from Dr. Tawanna Cooler in Chart. Triage advised pt to follow up with office on Monday with Dr. Tawanna Cooler about refilling the medication. Pt verbalized understanding. She is upset that office never followed through on calling in her medication. Protocol(s) Used: Office Note Recommended Outcome per Protocol: Information Noted and Sent to Office Reason for Outcome: Caller information to office Care Advice: ~ 04/

## 2013-08-04 ENCOUNTER — Other Ambulatory Visit: Payer: Self-pay

## 2013-08-04 DIAGNOSIS — Z1231 Encounter for screening mammogram for malignant neoplasm of breast: Secondary | ICD-10-CM

## 2013-08-20 ENCOUNTER — Ambulatory Visit
Admission: RE | Admit: 2013-08-20 | Discharge: 2013-08-20 | Disposition: A | Discharge status: Home / Self Care | Payer: Federal, State, Local not specified - PPO | Source: Ambulatory Visit

## 2013-08-20 ENCOUNTER — Encounter (INDEPENDENT_AMBULATORY_CARE_PROVIDER_SITE_OTHER): Payer: Self-pay

## 2013-08-20 DIAGNOSIS — Z1231 Encounter for screening mammogram for malignant neoplasm of breast: Secondary | ICD-10-CM

## 2015-11-06 ENCOUNTER — Encounter: Payer: Self-pay | Admitting: Rheumatology

## 2016-02-14 DIAGNOSIS — K080 Exfoliation of teeth due to systemic causes: Secondary | ICD-10-CM | POA: Diagnosis not present

## 2016-02-26 ENCOUNTER — Telehealth: Payer: Self-pay | Admitting: Radiology

## 2016-02-26 ENCOUNTER — Other Ambulatory Visit: Payer: Self-pay | Admitting: Rheumatology

## 2016-02-26 MED ORDER — MELOXICAM 15 MG PO TABS: 15.0000 mg | ORAL_TABLET | Freq: Every day | ORAL | 0 refills | Status: DC

## 2016-02-26 NOTE — Telephone Encounter (Signed)
Called patient to advise labs are due for Meloxicam  She states she has recently had labs She is correct, I was able to pull up labs in [facility] 11/06/15 WNL sent for scanning  Ok to refill per Mr  Carlyon Shadow

## 2016-02-26 NOTE — Telephone Encounter (Signed)
Needs labs for Meloxicam, no labs since 2016

## 2016-03-15 DIAGNOSIS — F3342 Major depressive disorder, recurrent, in full remission: Secondary | ICD-10-CM | POA: Diagnosis not present

## 2016-04-09 DIAGNOSIS — F3342 Major depressive disorder, recurrent, in full remission: Secondary | ICD-10-CM | POA: Diagnosis not present

## 2016-04-18 DIAGNOSIS — R5383 Other fatigue: Secondary | ICD-10-CM | POA: Insufficient documentation

## 2016-04-18 DIAGNOSIS — G43009 Migraine without aura, not intractable, without status migrainosus: Secondary | ICD-10-CM

## 2016-04-18 DIAGNOSIS — M7918 Myalgia, other site: Secondary | ICD-10-CM | POA: Insufficient documentation

## 2016-04-18 DIAGNOSIS — M19071 Primary osteoarthritis, right ankle and foot: Secondary | ICD-10-CM

## 2016-04-18 DIAGNOSIS — M19042 Primary osteoarthritis, left hand: Secondary | ICD-10-CM

## 2016-04-18 DIAGNOSIS — M19041 Primary osteoarthritis, right hand: Secondary | ICD-10-CM | POA: Insufficient documentation

## 2016-04-18 DIAGNOSIS — F5101 Primary insomnia: Secondary | ICD-10-CM | POA: Insufficient documentation

## 2016-04-18 DIAGNOSIS — M19072 Primary osteoarthritis, left ankle and foot: Secondary | ICD-10-CM

## 2016-04-18 HISTORY — DX: Primary osteoarthritis, left hand: M19.042

## 2016-04-18 HISTORY — DX: Myalgia, other site: M79.18

## 2016-04-18 HISTORY — DX: Other fatigue: R53.83

## 2016-04-18 HISTORY — DX: Primary insomnia: F51.01

## 2016-04-18 HISTORY — DX: Primary osteoarthritis, left ankle and foot: M19.072

## 2016-04-18 HISTORY — DX: Migraine without aura, not intractable, without status migrainosus: G43.009

## 2016-04-18 HISTORY — DX: Primary osteoarthritis, left ankle and foot: M19.071

## 2016-04-18 HISTORY — DX: Primary osteoarthritis, right hand: M19.041

## 2016-04-18 NOTE — Progress Notes (Deleted)
Office Visit Note  Patient: Jacqueline Sloan             Date of Birth: 1965-09-23           MRN: [id number]             PCP: Jacqueline Man, MD Referring: Jacqueline Cookey, MD Visit Date: 04/19/2016 Occupation: @GUAROCC @    Subjective:  No chief complaint on file.   History of Present Illness: Jacqueline Sloan is a 51 y.o. female ***   Activities of Daily Living:  Patient reports morning stiffness for *** {minute/hour:19697}.   Patient {ACTIONS;DENIES/REPORTS:21021675::"Denies"} nocturnal pain.  Difficulty dressing/grooming: {ACTIONS;DENIES/REPORTS:21021675::"Denies"} Difficulty climbing stairs: {ACTIONS;DENIES/REPORTS:21021675::"Denies"} Difficulty getting out of chair: {ACTIONS;DENIES/REPORTS:21021675::"Denies"} Difficulty using hands for taps, buttons, cutlery, and/or writing: {ACTIONS;DENIES/REPORTS:21021675::"Denies"}   No Rheumatology ROS completed.   PMFS History:  Patient Active Problem List   Diagnosis Date Noted  . Myofascial pain syndrome 04/18/2016  . Primary osteoarthritis of both hands 04/18/2016  . Primary osteoarthritis of both feet 04/18/2016  . History of migraine 04/18/2016  . Primary insomnia 04/18/2016  . Other fatigue 04/18/2016  . Viral URI 02/11/2011  . GEN OSTEOARTHROSIS INVOLVING MULTIPLE SITES 12/29/2008  . ADD 05/18/2007  . ANXIETY 12/17/2006  . ALLERGIC RHINITIS 12/17/2006    Past Medical History:  Diagnosis Date  . ADD 05/18/2007  . ALLERGIC RHINITIS 12/17/2006  . ANXIETY 12/17/2006  . GEN OSTEOARTHROSIS INVOLVING MULTIPLE SITES 12/29/2008  . Migraine     Family History  Problem Relation Age of Onset  . Heart disease Mother     HCF  . Hypertension Mother   . Arthritis Neg Hx     family hx  . Cancer Neg Hx     family hx endometrial ca   No past surgical history on file. Social History   Social History Narrative  . No narrative on file     Objective: Vital Signs: There were no vitals taken for this visit.   Physical  Exam   Musculoskeletal Exam: ***  CDAI Exam: No CDAI exam completed.    Investigation: Findings:  11/06/2015 CBC CMP normal  and UDS negative Narcotic agreement signed 11/06/2015  04/04/2015 X-rays of bilateral hands, 2 views, versus April 2014 shows bilateral DIP and PIP joint space narrowing.   Bilateral MCP narrowing.  Bilateral radiocarpal joint space narrowing as well as intercarpal joint space narrowing.  No erosions.  No changes versus April 2014.     X-rays of bilateral feet, 2 views, versus April 2014 shows bilateral DIP and PIP joint space narrowing.   No MCP joint space narrowing.   No erosions noted.  No change versus April 2014.      Imaging: No results found.  Speciality Comments: No specialty comments available.    Procedures:  No procedures performed Allergies: Patient has no known allergies.   Assessment / Plan:     Visit Diagnoses: Primary osteoarthritis of both hands  Primary osteoarthritis of both feet  Myofascial pain syndrome  History of migraine  Primary insomnia  Other fatigue  Anxiety and depression    Orders: No orders of the defined types were placed in this encounter.  No orders of the defined types were placed in this encounter.   Face-to-face time spent with patient was *** minutes. 50% of time was spent in counseling and coordination of care.  Follow-Up Instructions: No Follow-up on file.   Jacqueline Merino, MD  Note - This record has been created using Editor, commissioning.  Chart creation  errors have been sought, but may not always  have been located. Such creation errors do not reflect on  the standard of medical care.  

## 2016-04-19 ENCOUNTER — Ambulatory Visit: Payer: Self-pay | Admitting: Rheumatology

## 2016-04-26 DIAGNOSIS — F3342 Major depressive disorder, recurrent, in full remission: Secondary | ICD-10-CM | POA: Diagnosis not present

## 2016-05-15 ENCOUNTER — Ambulatory Visit (INDEPENDENT_AMBULATORY_CARE_PROVIDER_SITE_OTHER): Payer: Federal, State, Local not specified - PPO | Admitting: Rheumatology

## 2016-05-15 ENCOUNTER — Encounter: Payer: Self-pay | Admitting: Rheumatology

## 2016-05-15 VITALS — BP 108/69 | HR 80 | Resp 13 | Ht 66.5 in | Wt 148.0 lb

## 2016-05-15 DIAGNOSIS — M19041 Primary osteoarthritis, right hand: Secondary | ICD-10-CM

## 2016-05-15 DIAGNOSIS — R5383 Other fatigue: Secondary | ICD-10-CM

## 2016-05-15 DIAGNOSIS — M19042 Primary osteoarthritis, left hand: Secondary | ICD-10-CM | POA: Diagnosis not present

## 2016-05-15 DIAGNOSIS — M19071 Primary osteoarthritis, right ankle and foot: Secondary | ICD-10-CM | POA: Diagnosis not present

## 2016-05-15 DIAGNOSIS — M791 Myalgia: Secondary | ICD-10-CM | POA: Diagnosis not present

## 2016-05-15 DIAGNOSIS — M19072 Primary osteoarthritis, left ankle and foot: Secondary | ICD-10-CM | POA: Diagnosis not present

## 2016-05-15 DIAGNOSIS — M7918 Myalgia, other site: Secondary | ICD-10-CM

## 2016-05-15 LAB — CBC WITH DIFFERENTIAL/PLATELET
BASOS ABS: 0 {cells}/uL (ref 0–200)
Basophils Relative: 0 %
EOS PCT: 1 %
Eosinophils Absolute: 86 cells/uL (ref 15–500)
HCT: 39.9 % (ref 35.0–45.0)
HEMOGLOBIN: 13.3 g/dL (ref 11.7–15.5)
Lymphocytes Relative: 19 %
Lymphs Abs: 1634 cells/uL (ref 850–3900)
MCH: 31 pg (ref 27.0–33.0)
MCHC: 33.3 g/dL (ref 32.0–36.0)
MCV: 93 fL (ref 80.0–100.0)
MPV: 10.3 fL (ref 7.5–12.5)
Monocytes Absolute: 602 cells/uL (ref 200–950)
Monocytes Relative: 7 %
NEUTROS PCT: 73 %
Neutro Abs: 6278 cells/uL (ref 1500–7800)
PLATELETS: 328 10*3/uL (ref 140–400)
RBC: 4.29 MIL/uL (ref 3.80–5.10)
RDW: 12.9 % (ref 11.0–15.0)
WBC: 8.6 10*3/uL (ref 3.8–10.8)

## 2016-05-15 LAB — COMPLETE METABOLIC PANEL WITH GFR
ALBUMIN: 4.2 g/dL (ref 3.6–5.1)
ALK PHOS: 50 U/L (ref 33–115)
ALT: 11 U/L (ref 6–29)
AST: 16 U/L (ref 10–35)
BUN: 14 mg/dL (ref 7–25)
CHLORIDE: 105 mmol/L (ref 98–110)
CO2: 27 mmol/L (ref 20–31)
Calcium: 9.7 mg/dL (ref 8.6–10.2)
Creat: 0.72 mg/dL (ref 0.50–1.10)
GFR, Est African American: >89 mL/min (ref >=60)
GLUCOSE: 117 mg/dL — AB (ref 65–99)
POTASSIUM: 3.8 mmol/L (ref 3.5–5.3)
SODIUM: 139 mmol/L (ref 135–146)
Total Bilirubin: 0.6 mg/dL (ref 0.2–1.2)
Total Protein: 7 g/dL (ref 6.1–8.1)

## 2016-05-15 NOTE — Progress Notes (Signed)
Office Visit Note  Patient: Jacqueline Sloan             Date of Birth: 15-Oct-1965           MRN: [id number]             PCP: No primary care provider on file. Referring: Dorena Cookey, MD Visit Date: 05/15/2016 Occupation: @GUAROCC @    Subjective:  Follow-up Myofascial pain syndrome, osteoarthritis  History of Present Illness: Jacqueline Sloan is a 51 y.o. female  Last seen 11/06/2015. Patient is doing relatively well with her fibromyalgia. She's not bothered by it much at all.  Currently she is having some right neck pain. She states it is just started last night. She states that she "slept wrong" and then her pain started. She does not feel that she needs a cortisone injection at this time. I am agreeable.  She has OA of the hands and OA of the feet with ongoing discomfort to both of those areas. She does have osteoarthritis supplements but "I forget to take a most of the time". We did refer her to [facility] at one time for her neck pain and low back pain.  Patient uses tramadol regularly which "takes care of all of my pain". I advised the patient that instead of tramadol she should use OA supplements instead.  She is due for repeat lab work. CBC with differential and CMP with GFR will be done today. Patient is remote.   Activities of Daily Living:  Patient reports morning stiffness for 15 minutes.   Patient Denies nocturnal pain.  Difficulty dressing/grooming: Denies Difficulty climbing stairs: Denies Difficulty getting out of chair: Denies Difficulty using hands for taps, buttons, cutlery, and/or writing: Denies   Review of Systems  Constitutional: Negative for fatigue.  HENT: Negative for mouth sores and mouth dryness.   Eyes: Negative for dryness.  Respiratory: Negative for shortness of breath.   Gastrointestinal: Negative for constipation and diarrhea.  Musculoskeletal: Negative for myalgias and myalgias.  Skin: Negative for sensitivity to sunlight.   Psychiatric/Behavioral: Negative for decreased concentration and sleep disturbance.    PMFS History:  Patient Active Problem List   Diagnosis Date Noted  . Myofascial pain syndrome 04/18/2016  . Primary osteoarthritis of both hands 04/18/2016  . Primary osteoarthritis of both feet 04/18/2016  . History of migraine 04/18/2016  . Primary insomnia 04/18/2016  . Other fatigue 04/18/2016  . Viral URI 02/11/2011  . GEN OSTEOARTHROSIS INVOLVING MULTIPLE SITES 12/29/2008  . ADD 05/18/2007  . ANXIETY 12/17/2006  . ALLERGIC RHINITIS 12/17/2006    Past Medical History:  Diagnosis Date  . ADD 05/18/2007  . ALLERGIC RHINITIS 12/17/2006  . ANXIETY 12/17/2006  . GEN OSTEOARTHROSIS INVOLVING MULTIPLE SITES 12/29/2008  . Migraine     Family History  Problem Relation Age of Onset  . Heart disease Mother     HCF  . Hypertension Mother   . Arthritis Neg Hx     family hx  . Cancer Neg Hx     family hx endometrial ca   No past surgical history on file. Social History   Social History Narrative  . No narrative on file     Objective: Vital Signs: BP 108/69   Pulse 80   Resp 13   Ht 5' 6.5" (1.689 m)   Wt 148 lb (67.1 kg)   BMI 23.53 kg/m    Physical Exam  Constitutional: She is oriented to person, place, and time. She appears  well-developed and well-nourished.  HENT:  Head: Normocephalic and atraumatic.  Eyes: EOM are normal. Pupils are equal, round, and reactive to light.  Cardiovascular: Normal rate, regular rhythm and normal heart sounds.  Exam reveals no gallop and no friction rub.   No murmur heard. Pulmonary/Chest: Effort normal and breath sounds normal. She has no wheezes. She has no rales.  Abdominal: Soft. Bowel sounds are normal. She exhibits no distension. There is no tenderness. There is no guarding. No hernia.  Musculoskeletal: Normal range of motion. She exhibits no edema, tenderness or deformity.  Lymphadenopathy:    She has no cervical adenopathy.  Neurological:  She is alert and oriented to person, place, and time. Coordination normal.  Skin: Skin is warm and dry. Capillary refill takes less than 2 seconds. No rash noted.  Psychiatric: She has a normal mood and affect. Her behavior is normal.  Nursing note and vitals reviewed.    Musculoskeletal Exam:  Full range of motion of all joints Grip strength is equal and strong bilaterally For myalgia tender points are all absent  CDAI Exam: CDAI Homunculus Exam:   Joint Counts:  CDAI Tender Joint count: 0 CDAI Swollen Joint count: 0  Global Assessments:  Patient Global Assessment: 0 Provider Global Assessment: 0  No synovitis on examination  Investigation: No additional findings. No visits with results within 6 Month(s) from this visit.  Latest known visit with results is:  Office Visit on 07/08/2012  Component Date Value Ref Range Status  . WBC 07/08/2012 3.9* 4.5 - 10.5 K/uL Final  . RBC 07/08/2012 4.09  3.87 - 5.11 Mil/uL Final  . Hemoglobin 07/08/2012 12.8  12.0 - 15.0 g/dL Final  . HCT 07/08/2012 37.8  36.0 - 46.0 % Final  . MCV 07/08/2012 92.5  78.0 - 100.0 fl Final  . MCHC 07/08/2012 33.9  30.0 - 36.0 g/dL Final  . RDW 07/08/2012 12.9  11.5 - 14.6 % Final  . Platelets 07/08/2012 273.0  150.0 - 400.0 K/uL Final  . Neutrophils Relative % 07/08/2012 53.6  43.0 - 77.0 % Final  . Lymphocytes Relative 07/08/2012 31.8  12.0 - 46.0 % Final  . Monocytes Relative 07/08/2012 9.1  3.0 - 12.0 % Final  . Eosinophils Relative 07/08/2012 5.0  0.0 - 5.0 % Final  . Basophils Relative 07/08/2012 0.5  0.0 - 3.0 % Final  . Neutro Abs 07/08/2012 2.1  1.4 - 7.7 K/uL Final  . Lymphs Abs 07/08/2012 1.2  0.7 - 4.0 K/uL Final  . Monocytes Absolute 07/08/2012 0.4  0.1 - 1.0 K/uL Final  . Eosinophils Absolute 07/08/2012 0.2  0.0 - 0.7 K/uL Final  . Basophils Absolute 07/08/2012 0.0  0.0 - 0.1 K/uL Final  . Sodium 07/08/2012 139  135 - 145 mEq/L Final  . Potassium 07/08/2012 4.4  3.5 - 5.1 mEq/L Final  .  Chloride 07/08/2012 105  96 - 112 mEq/L Final  . CO2 07/08/2012 28  19 - 32 mEq/L Final  . Glucose, Bld 07/08/2012 100* 70 - 99 mg/dL Final  . BUN 07/08/2012 10  6 - 23 mg/dL Final  . Creatinine, Ser 07/08/2012 0.6  0.4 - 1.2 mg/dL Final  . Total Bilirubin 07/08/2012 0.8  0.3 - 1.2 mg/dL Final  . Alkaline Phosphatase 07/08/2012 50  39 - 117 U/L Final  . AST 07/08/2012 18  0 - 37 U/L Final  . ALT 07/08/2012 13  0 - 35 U/L Final  . Total Protein 07/08/2012 7.0  6.0 - 8.3 g/dL  Final  . Albumin 07/08/2012 3.9  3.5 - 5.2 g/dL Final  . Calcium 07/08/2012 8.7  8.4 - 10.5 mg/dL Final  . GFR 07/08/2012 108.22  >60.00 mL/min Final  . TSH 07/08/2012 1.02  0.35 - 5.50 uIU/mL Final  . Hgb A1c MFr Bld 07/08/2012 5.2  4.6 - 6.5 % Final   The last labs we have for patient is from July 2017 and we'll need labs today that include CBC with differential, CMP with GFR. Patient is agreeable to get them in our office  Imaging: No results found.  Speciality Comments: No specialty comments available.    Procedures:  No procedures performed Allergies: Patient has no known allergies.   Assessment / Plan:     Visit Diagnoses: Myofascial pain syndrome  Primary osteoarthritis of both hands  Primary osteoarthritis of both feet  Fatigue, unspecified type - Plan: CBC with Differential/Platelet, COMPLETE METABOLIC PANEL WITH GFR   Plan: #1: Patient is due for an updated blood work. Last blood work we have was CBC with differential, CMP with GFR, drug screen. Patient was positive for opioids.  #2: History of fibromyalgia but today she does not have any fiber myalgia tender points.  #3: OA of the hands. Ongoing discomfort.  #4: OA of the feet. Ongoing discomfort.  #5: As offered the patient OA supplement list but she forgets to take it. She states "my tramadol helps tremendously. She states that she can take up to 6 daily per our prescription. I advised the patient that we are unable to give her 6  pills per day. At the most we can do 1 or 2 pills daily at bedtime. Patient is agreeable. Return to clinic in 22mos   Orders: Orders Placed This Encounter  Procedures  . CBC with Differential/Platelet  . COMPLETE METABOLIC PANEL WITH GFR   No orders of the defined types were placed in this encounter.   Face-to-face time spent with patient was 30 minutes. 50% of time was spent in counseling and coordination of care.  Follow-Up Instructions: Return in about 6 months (around 11/12/2016) for [place], [place].   Eliezer Lofts, PA-C  Note - This record has been created using [employer].  Chart creation errors have been sought, but may not always  have been located. Such creation errors do not reflect on  the standard of medical care.

## 2016-05-21 ENCOUNTER — Other Ambulatory Visit: Payer: Self-pay | Admitting: Radiology

## 2016-05-21 DIAGNOSIS — Z79899 Other long term (current) drug therapy: Secondary | ICD-10-CM

## 2016-06-07 DIAGNOSIS — F3342 Major depressive disorder, recurrent, in full remission: Secondary | ICD-10-CM | POA: Diagnosis not present

## 2016-07-19 DIAGNOSIS — F3342 Major depressive disorder, recurrent, in full remission: Secondary | ICD-10-CM | POA: Diagnosis not present

## 2016-07-31 DIAGNOSIS — Z01419 Encounter for gynecological examination (general) (routine) without abnormal findings: Secondary | ICD-10-CM | POA: Diagnosis not present

## 2016-07-31 DIAGNOSIS — Z1231 Encounter for screening mammogram for malignant neoplasm of breast: Secondary | ICD-10-CM | POA: Diagnosis not present

## 2016-07-31 DIAGNOSIS — Z6823 Body mass index (BMI) 23.0-23.9, adult: Secondary | ICD-10-CM | POA: Diagnosis not present

## 2016-08-07 DIAGNOSIS — Z124 Encounter for screening for malignant neoplasm of cervix: Secondary | ICD-10-CM | POA: Diagnosis not present

## 2016-08-07 DIAGNOSIS — Z1151 Encounter for screening for human papillomavirus (HPV): Secondary | ICD-10-CM | POA: Diagnosis not present

## 2016-08-08 DIAGNOSIS — K6289 Other specified diseases of anus and rectum: Secondary | ICD-10-CM | POA: Diagnosis not present

## 2016-08-08 DIAGNOSIS — K645 Perianal venous thrombosis: Secondary | ICD-10-CM | POA: Diagnosis not present

## 2016-08-08 DIAGNOSIS — Z800 Family history of malignant neoplasm of digestive organs: Secondary | ICD-10-CM | POA: Diagnosis not present

## 2016-08-21 DIAGNOSIS — K080 Exfoliation of teeth due to systemic causes: Secondary | ICD-10-CM | POA: Diagnosis not present

## 2016-08-23 DIAGNOSIS — F3342 Major depressive disorder, recurrent, in full remission: Secondary | ICD-10-CM | POA: Diagnosis not present

## 2016-09-01 DIAGNOSIS — M7551 Bursitis of right shoulder: Secondary | ICD-10-CM | POA: Diagnosis not present

## 2016-10-10 ENCOUNTER — Other Ambulatory Visit: Payer: Self-pay | Admitting: Rheumatology

## 2016-10-10 DIAGNOSIS — Z5181 Encounter for therapeutic drug level monitoring: Secondary | ICD-10-CM

## 2016-10-11 NOTE — Telephone Encounter (Signed)
Last Visit: 05/15/16 Next Visit: due August 2018. Message sent to front to schedule patient Narc Agreement: 11/06/15 UDS: 10/2014  Will have patient update UDS  Okay to refill 30 day supply Tramadol?

## 2016-10-11 NOTE — Telephone Encounter (Signed)
ok 

## 2016-10-11 NOTE — Telephone Encounter (Signed)
Prescription called to the pharmacy. Patient will come next week to update UDS.

## 2016-10-16 ENCOUNTER — Other Ambulatory Visit: Payer: Self-pay | Admitting: Radiology

## 2016-10-16 DIAGNOSIS — Z79899 Other long term (current) drug therapy: Secondary | ICD-10-CM | POA: Diagnosis not present

## 2016-10-16 DIAGNOSIS — F330 Major depressive disorder, recurrent, mild: Secondary | ICD-10-CM | POA: Diagnosis not present

## 2016-10-16 DIAGNOSIS — Z5181 Encounter for therapeutic drug level monitoring: Secondary | ICD-10-CM | POA: Diagnosis not present

## 2016-10-16 LAB — CBC WITH DIFFERENTIAL/PLATELET
BASOS ABS: 57 {cells}/uL (ref 0–200)
Basophils Relative: 1 %
EOS ABS: 171 {cells}/uL (ref 15–500)
EOS PCT: 3 %
HCT: 40.1 % (ref 35.0–45.0)
Hemoglobin: 13.3 g/dL (ref 11.7–15.5)
LYMPHS PCT: 36 %
Lymphs Abs: 2052 cells/uL (ref 850–3900)
MCH: 31.3 pg (ref 27.0–33.0)
MCHC: 33.2 g/dL (ref 32.0–36.0)
MCV: 94.4 fL (ref 80.0–100.0)
MONOS PCT: 9 %
MPV: 10.2 fL (ref 7.5–12.5)
Monocytes Absolute: 513 cells/uL (ref 200–950)
NEUTROS ABS: 2907 {cells}/uL (ref 1500–7800)
Neutrophils Relative %: 51 %
PLATELETS: 283 10*3/uL (ref 140–400)
RBC: 4.25 MIL/uL (ref 3.80–5.10)
RDW: 13.5 % (ref 11.0–15.0)
WBC: 5.7 10*3/uL (ref 3.8–10.8)

## 2016-10-17 LAB — COMPLETE METABOLIC PANEL WITH GFR
ALT: 11 U/L (ref 6–29)
AST: 18 U/L (ref 10–35)
Albumin: 4.2 g/dL (ref 3.6–5.1)
Alkaline Phosphatase: 50 U/L (ref 33–130)
BILIRUBIN TOTAL: 0.4 mg/dL (ref 0.2–1.2)
BUN: 11 mg/dL (ref 7–25)
CHLORIDE: 104 mmol/L (ref 98–110)
CO2: 22 mmol/L (ref 20–31)
Calcium: 8.9 mg/dL (ref 8.6–10.4)
Creat: 0.66 mg/dL (ref 0.50–1.05)
GLUCOSE: 89 mg/dL (ref 65–99)
POTASSIUM: 4.2 mmol/L (ref 3.5–5.3)
SODIUM: 137 mmol/L (ref 135–146)
Total Protein: 6.7 g/dL (ref 6.1–8.1)

## 2016-10-23 LAB — PAIN MGMT, PROFILE 5 W/CONF, U
ALPHAHYDROXYALPRAZOLAM: NEGATIVE ng/mL (ref <25)
Alphahydroxymidazolam: NEGATIVE ng/mL (ref <50)
Alphahydroxytriazolam: NEGATIVE ng/mL (ref <50)
Aminoclonazepam: NEGATIVE ng/mL (ref <25)
Amphetamines: NEGATIVE ng/mL (ref <500)
Barbiturates: NEGATIVE ng/mL (ref <300)
Benzodiazepines: NEGATIVE ng/mL (ref <100)
COCAINE METABOLITE: NEGATIVE ng/mL (ref <150)
CREATININE: 150.5 mg/dL (ref >=20.0)
HYDROXYETHYLFLURAZEPAM: NEGATIVE ng/mL (ref <50)
LORAZEPAM: NEGATIVE ng/mL (ref <50)
MARIJUANA METABOLITE: NEGATIVE ng/mL (ref <20)
Methadone Metabolite: NEGATIVE ng/mL (ref <100)
Nordiazepam: NEGATIVE ng/mL (ref <50)
OPIATES: NEGATIVE ng/mL (ref <100)
OXAZEPAM: NEGATIVE ng/mL (ref <50)
Oxidant: NEGATIVE ug/mL (ref <200)
Oxycodone: NEGATIVE ng/mL (ref <100)
Temazepam: NEGATIVE ng/mL (ref <50)
pH: 6.42 (ref 4.5–9.0)

## 2016-10-23 NOTE — Progress Notes (Signed)
C/w

## 2016-11-08 DIAGNOSIS — F330 Major depressive disorder, recurrent, mild: Secondary | ICD-10-CM | POA: Diagnosis not present

## 2016-12-06 ENCOUNTER — Other Ambulatory Visit: Payer: Self-pay | Admitting: Rheumatology

## 2016-12-06 NOTE — Telephone Encounter (Signed)
Prescription called to the pharmacy  

## 2016-12-06 NOTE — Telephone Encounter (Signed)
ok 

## 2016-12-06 NOTE — Telephone Encounter (Signed)
Last Visit: 05/15/16 Next Visit: due August 2018. Message sent to front to schedule patient Narc Agreement: 11/06/15 UDS: 10/16/16 Last Fill: 10/11/16  Okay to refill Tramadol?

## 2017-01-01 ENCOUNTER — Other Ambulatory Visit: Payer: Self-pay | Admitting: Rheumatology

## 2017-01-01 MED ORDER — MELOXICAM 15 MG PO TABS: 15.0000 mg | ORAL_TABLET | Freq: Every day | ORAL | 0 refills | Status: DC

## 2017-01-01 NOTE — Telephone Encounter (Signed)
Patient called to request refill of mobic sent to [facility] in [place] on [address].

## 2017-01-01 NOTE — Telephone Encounter (Signed)
Last Visit: 05/15/16 Next Visit: 03/05/17 Labs: 10/16/16 WNL  Okay to refill per Dr. Estanislado Pandy

## 2017-01-17 DIAGNOSIS — F330 Major depressive disorder, recurrent, mild: Secondary | ICD-10-CM | POA: Diagnosis not present

## 2017-02-21 ENCOUNTER — Other Ambulatory Visit: Payer: Self-pay | Admitting: Rheumatology

## 2017-02-21 DIAGNOSIS — F330 Major depressive disorder, recurrent, mild: Secondary | ICD-10-CM | POA: Diagnosis not present

## 2017-02-21 NOTE — Telephone Encounter (Addendum)
Last Visit: 05/15/16 Next Visit: 03/05/17 UDS: 10/16/16 Narc Agreement: 10/2015  Okay to refill per Dr. Estanislado Pandy

## 2017-02-22 DIAGNOSIS — K121 Other forms of stomatitis: Secondary | ICD-10-CM | POA: Diagnosis not present

## 2017-02-22 DIAGNOSIS — R05 Cough: Secondary | ICD-10-CM | POA: Diagnosis not present

## 2017-02-23 NOTE — Progress Notes (Signed)
Office Visit Note  Patient: Jacqueline Sloan             Date of Birth: 07/22/1965           MRN: [id number]             PCP: Patient, No Pcp Per Referring: No ref. provider found Visit Date: 03/05/2017 Occupation: @GUAROCC @    Subjective:  Hands and feet pain    History of Present Illness: Merissa P Elizarraraz is a 51 y.o. female with a history of osteoarthritis and myofascial pain syndrome.  Patient states she has occasional pain in her hands and feet.  Denies any swelling. Patient states she has trapezius tightness and mild neck pain.  She states this has been an ongoing issue but has not worsened.  She is taking tramadol as needed. She states her pain on tramadol is 4/10 and without tramadol her pain can be a 8/10.   Activities of Daily Living:  Patient reports morning stiffness for 10 minutes.   Patient Denies nocturnal pain.  Difficulty dressing/grooming: Denies Difficulty climbing stairs: Denies Difficulty getting out of chair: Denies Difficulty using hands for taps, buttons, cutlery, and/or writing: Denies   Review of Systems  Constitutional: Negative for fatigue, night sweats, weight gain, weight loss and weakness.  HENT: Negative for mouth sores, trouble swallowing, trouble swallowing, mouth dryness and nose dryness.   Eyes: Negative for pain, redness, visual disturbance and dryness.  Respiratory: Negative for cough, shortness of breath and difficulty breathing.   Cardiovascular: Negative for chest pain, palpitations, hypertension, irregular heartbeat and swelling in legs/feet.  Gastrointestinal: Negative for blood in stool, constipation and diarrhea.  Endocrine: Negative for increased urination.  Genitourinary: Negative for vaginal dryness.  Musculoskeletal: Positive for myalgias, morning stiffness and myalgias. Negative for arthralgias, joint pain, joint swelling, muscle weakness and muscle tenderness.  Skin: Negative for color change, rash, hair loss, skin tightness,  ulcers and sensitivity to sunlight.  Allergic/Immunologic: Negative for susceptible to infections.  Neurological: Negative for dizziness, memory loss and night sweats.  Hematological: Negative for swollen glands.  Psychiatric/Behavioral: Negative for depressed mood and sleep disturbance. The patient is not nervous/anxious.     PMFS History:  Patient Active Problem List   Diagnosis Date Noted  . Myofascial pain syndrome 04/18/2016  . Primary osteoarthritis of both hands 04/18/2016  . Primary osteoarthritis of both feet 04/18/2016  . History of migraine 04/18/2016  . Primary insomnia 04/18/2016  . Other fatigue 04/18/2016  . Viral URI 02/11/2011  . GEN OSTEOARTHROSIS INVOLVING MULTIPLE SITES 12/29/2008  . ADD 05/18/2007  . ANXIETY 12/17/2006  . ALLERGIC RHINITIS 12/17/2006    Past Medical History:  Diagnosis Date  . ADD 05/18/2007  . ALLERGIC RHINITIS 12/17/2006  . ANXIETY 12/17/2006  . GEN OSTEOARTHROSIS INVOLVING MULTIPLE SITES 12/29/2008  . Migraine     Family History  Problem Relation Age of Onset  . Heart disease Mother        HCF  . Hypertension Mother   . Arthritis Neg Hx        family hx  . Cancer Neg Hx        family hx endometrial ca   Past Surgical History:  Procedure Laterality Date  . CESAREAN SECTION  2002  . TONSILECTOMY, ADENOIDECTOMY, BILATERAL MYRINGOTOMY AND TUBES  2014   Social History   Social History Narrative  . Not on file     Objective: Vital Signs: BP 106/72 (BP Location: Left Arm, Patient Position: Sitting, Cuff  Size: Normal)   Pulse 71   Resp 17   Ht 5' 6.5" (1.689 m)   Wt 147 lb (66.7 kg)   BMI 23.37 kg/m    Physical Exam  Constitutional: She is oriented to person, place, and time. She appears well-developed and well-nourished.  HENT:  Head: Normocephalic and atraumatic.  Eyes: Conjunctivae and EOM are normal.  Neck: Normal range of motion.  Cardiovascular: Normal rate, regular rhythm, normal heart sounds and intact distal  pulses.  Pulmonary/Chest: Effort normal and breath sounds normal.  Abdominal: Soft. Bowel sounds are normal.  Lymphadenopathy:    She has no cervical adenopathy.  Neurological: She is alert and oriented to person, place, and time.  Skin: Skin is warm and dry. Capillary refill takes less than 2 seconds.  Psychiatric: She has a normal mood and affect. Her behavior is normal.  Nursing note and vitals reviewed.    Musculoskeletal Exam: C-spine, thoracic, and lumbar spine good ROM.  Tendernes upon palpation of trapezius muscles.  Shoulder joints, elbow joints, and wrist joints good ROM with no synovitis. MCPs, PIPs, and DIPs good ROM.  No synovitis.  DIP synovial thickening bilaterally.  Hips joints, knee joints, and ankle joints good ROM.  MTPs, PIPs, and DIPs are good ROM with no synovitis.  No greater trochanteric bursitis.     CDAI Exam: No CDAI exam completed.    Investigation: No additional findings.UDS: 10/16/2016 Narc agreement: updated today 03/05/17 CBC Latest Ref Rng & Units 10/16/2016 05/15/2016 07/08/2012  WBC 3.8 - 10.8 K/uL 5.7 8.6 3.9(L)  Hemoglobin 11.7 - 15.5 g/dL 13.3 13.3 12.8  Hematocrit 35.0 - 45.0 % 40.1 39.9 37.8  Platelets 140 - 400 K/uL 283 328 273.0   CMP Latest Ref Rng & Units 10/16/2016 05/15/2016 07/08/2012  Glucose 65 - 99 mg/dL 89 117(H) 100(H)  BUN 7 - 25 mg/dL 11 14 10   Creatinine 0.50 - 1.05 mg/dL 0.66 0.72 0.6  Sodium 135 - 146 mmol/L 137 139 139  Potassium 3.5 - 5.3 mmol/L 4.2 3.8 4.4  Chloride 98 - 110 mmol/L 104 105 105  CO2 20 - 31 mmol/L 22 27 28   Calcium 8.6 - 10.4 mg/dL 8.9 9.7 8.7  Total Protein 6.1 - 8.1 g/dL 6.7 7.0 7.0  Total Bilirubin 0.2 - 1.2 mg/dL 0.4 0.6 0.8  Alkaline Phos 33 - 130 U/L 50 50 50  AST 10 - 35 U/L 18 16 18   ALT 6 - 29 U/L 11 11 13     Imaging: No results found.  Speciality Comments: No specialty comments available.    Procedures:  No procedures performed Allergies: Penicillins   Assessment / Plan:     Visit  Diagnoses: Myofascial pain syndrome: Patient is clinically doing well.  She has very little myalgias.  Her insomnia and fatigue have improved.    Primary osteoarthritis of both hands: Clinically stable.  She continues to take Mobic 15 mg and Tramadol 50 mg as needed.    Primary osteoarthritis of both feet: Clinically stable.  Patient wears proper fitting shoes.    Medication monitoring encounter - Patient takes Mobic 15 mg and Tramadol 50 mg as needed for pain relief.  Patient will have repeat labs in January to check CBC and CMP.  Patient's UDS is up to date (10/16/16) and narcotic agreement was filled out today (03/05/17).    Plan: COMPLETE METABOLIC PANEL WITH GFR, CBC with Differential/Platelet  Primary insomnia: Improved   Other fatigue: improved   History of migraine: Patient denies having  any recent migraines.  She states she has had mild headaches in the mornings lately, but attributes it to increasing her dose of Lamictal.  She is going to follow up with her prescribing physician.    History of depression: Medically managed   History of anxiety: Medically managed    Orders: Orders Placed This Encounter  Procedures  . COMPLETE METABOLIC PANEL WITH GFR  . CBC with Differential/Platelet   No orders of the defined types were placed in this encounter.   Follow-Up Instructions: Return in about 6 months (around 09/02/2017) for Osteoarthritis.   Bo Merino, MD  Note - This record has been created using Editor, commissioning.  Chart creation errors have been sought, but may not always  have been located. Such creation errors do not reflect on  the standard of medical care.

## 2017-02-26 DIAGNOSIS — K080 Exfoliation of teeth due to systemic causes: Secondary | ICD-10-CM | POA: Diagnosis not present

## 2017-03-05 ENCOUNTER — Ambulatory Visit: Payer: Federal, State, Local not specified - PPO | Admitting: Rheumatology

## 2017-03-05 ENCOUNTER — Encounter: Payer: Self-pay | Admitting: Rheumatology

## 2017-03-05 VITALS — BP 106/72 | HR 71 | Resp 17 | Ht 66.5 in | Wt 147.0 lb

## 2017-03-05 DIAGNOSIS — R5383 Other fatigue: Secondary | ICD-10-CM | POA: Diagnosis not present

## 2017-03-05 DIAGNOSIS — Z5181 Encounter for therapeutic drug level monitoring: Secondary | ICD-10-CM | POA: Diagnosis not present

## 2017-03-05 DIAGNOSIS — M19041 Primary osteoarthritis, right hand: Secondary | ICD-10-CM

## 2017-03-05 DIAGNOSIS — F5101 Primary insomnia: Secondary | ICD-10-CM

## 2017-03-05 DIAGNOSIS — M19072 Primary osteoarthritis, left ankle and foot: Secondary | ICD-10-CM | POA: Diagnosis not present

## 2017-03-05 DIAGNOSIS — M7918 Myalgia, other site: Secondary | ICD-10-CM | POA: Diagnosis not present

## 2017-03-05 DIAGNOSIS — Z8659 Personal history of other mental and behavioral disorders: Secondary | ICD-10-CM | POA: Diagnosis not present

## 2017-03-05 DIAGNOSIS — Z8669 Personal history of other diseases of the nervous system and sense organs: Secondary | ICD-10-CM | POA: Diagnosis not present

## 2017-03-05 DIAGNOSIS — M19071 Primary osteoarthritis, right ankle and foot: Secondary | ICD-10-CM | POA: Diagnosis not present

## 2017-03-05 DIAGNOSIS — M542 Cervicalgia: Secondary | ICD-10-CM

## 2017-03-05 DIAGNOSIS — M19042 Primary osteoarthritis, left hand: Secondary | ICD-10-CM

## 2017-03-05 NOTE — Patient Instructions (Signed)
Standing Labs We placed an order today for your standing lab work.    Please come back and get your standing labs in January  We have open lab Monday through Friday from 8:30-11:30 AM and 1:30-4 PM at the office of Dr. Bo Merino.   The office is located at [address] [place], [place], [place] [address] No appointment is necessary.   Labs are drawn by [facility].  You may receive a bill from [facility] for your lab work. If you have any questions regarding directions or hours of operation,  please call [phone].

## 2017-04-07 ENCOUNTER — Other Ambulatory Visit: Payer: Self-pay | Admitting: Rheumatology

## 2017-04-07 NOTE — Telephone Encounter (Signed)
Last Visit: 03/05/17 Next Visit: 09/03/17 Labs: 10/16/16 WNL  Okay to refill per Dr. Estanislado Pandy

## 2017-04-18 DIAGNOSIS — F330 Major depressive disorder, recurrent, mild: Secondary | ICD-10-CM | POA: Diagnosis not present

## 2017-05-14 ENCOUNTER — Other Ambulatory Visit: Payer: Self-pay | Admitting: Rheumatology

## 2017-05-14 ENCOUNTER — Other Ambulatory Visit: Payer: Self-pay

## 2017-05-14 DIAGNOSIS — Z5181 Encounter for therapeutic drug level monitoring: Secondary | ICD-10-CM

## 2017-05-14 LAB — COMPLETE METABOLIC PANEL WITH GFR
AG RATIO: 1.7 (calc) (ref 1.0–2.5)
ALT: 18 U/L (ref 6–29)
AST: 22 U/L (ref 10–35)
Albumin: 4.2 g/dL (ref 3.6–5.1)
Alkaline phosphatase (APISO): 54 U/L (ref 33–130)
BUN: 13 mg/dL (ref 7–25)
CALCIUM: 9.3 mg/dL (ref 8.6–10.4)
CO2: 30 mmol/L (ref 20–32)
CREATININE: 0.66 mg/dL (ref 0.50–1.05)
Chloride: 106 mmol/L (ref 98–110)
GFR, EST AFRICAN AMERICAN: 119 mL/min/{1.73_m2} (ref >=60)
GFR, EST NON AFRICAN AMERICAN: 103 mL/min/{1.73_m2} (ref >=60)
GLUCOSE: 84 mg/dL (ref 65–99)
Globulin: 2.5 g/dL (calc) (ref 1.9–3.7)
Potassium: 4 mmol/L (ref 3.5–5.3)
Sodium: 141 mmol/L (ref 135–146)
TOTAL PROTEIN: 6.7 g/dL (ref 6.1–8.1)
Total Bilirubin: 0.5 mg/dL (ref 0.2–1.2)

## 2017-05-14 LAB — CBC WITH DIFFERENTIAL/PLATELET
BASOS PCT: 0.7 %
Basophils Absolute: 42 cells/uL (ref 0–200)
Eosinophils Absolute: 102 cells/uL (ref 15–500)
Eosinophils Relative: 1.7 %
HCT: 37.6 % (ref 35.0–45.0)
Hemoglobin: 12.9 g/dL (ref 11.7–15.5)
Lymphs Abs: 1848 cells/uL (ref 850–3900)
MCH: 30.9 pg (ref 27.0–33.0)
MCHC: 34.3 g/dL (ref 32.0–36.0)
MCV: 90.2 fL (ref 80.0–100.0)
MPV: 10.8 fL (ref 7.5–12.5)
Monocytes Relative: 7.2 %
Neutro Abs: 3576 cells/uL (ref 1500–7800)
Neutrophils Relative %: 59.6 %
Platelets: 290 10*3/uL (ref 140–400)
RBC: 4.17 10*6/uL (ref 3.80–5.10)
RDW: 12.1 % (ref 11.0–15.0)
TOTAL LYMPHOCYTE: 30.8 %
WBC: 6 10*3/uL (ref 3.8–10.8)
WBCMIX: 432 {cells}/uL (ref 200–950)

## 2017-05-14 NOTE — Telephone Encounter (Signed)
Last visit: 03/05/2017 Next visit: 09/03/2017 UDS: 10/16/2016 Narc agreement: 03/05/2017  Last fill: 02/24/2017  Okay to refill Tramadol?

## 2017-05-16 DIAGNOSIS — F3342 Major depressive disorder, recurrent, in full remission: Secondary | ICD-10-CM | POA: Diagnosis not present

## 2017-05-21 DIAGNOSIS — F3342 Major depressive disorder, recurrent, in full remission: Secondary | ICD-10-CM | POA: Diagnosis not present

## 2017-06-03 ENCOUNTER — Other Ambulatory Visit: Payer: Self-pay

## 2017-06-03 ENCOUNTER — Encounter: Payer: Self-pay | Admitting: Family Medicine

## 2017-06-03 ENCOUNTER — Ambulatory Visit: Payer: Federal, State, Local not specified - PPO | Admitting: Family Medicine

## 2017-06-03 VITALS — BP 126/84 | HR 81 | Temp 98.2°F | Resp 16 | Ht 66.5 in | Wt 151.0 lb

## 2017-06-03 DIAGNOSIS — G43009 Migraine without aura, not intractable, without status migrainosus: Secondary | ICD-10-CM

## 2017-06-03 DIAGNOSIS — R002 Palpitations: Secondary | ICD-10-CM

## 2017-06-03 DIAGNOSIS — F339 Major depressive disorder, recurrent, unspecified: Secondary | ICD-10-CM | POA: Diagnosis not present

## 2017-06-03 DIAGNOSIS — M7918 Myalgia, other site: Secondary | ICD-10-CM

## 2017-06-03 HISTORY — DX: Major depressive disorder, recurrent, unspecified: F33.9

## 2017-06-03 LAB — LIPID PANEL
Cholesterol: 199 mg/dL (ref 0–200)
HDL: 48.8 mg/dL (ref >39.00)
LDL CALC: 126 mg/dL — AB (ref 0–99)
NONHDL: 149.76
Total CHOL/HDL Ratio: 4
Triglycerides: 117 mg/dL (ref 0.0–149.0)
VLDL: 23.4 mg/dL (ref 0.0–40.0)

## 2017-06-03 LAB — TSH: TSH: 0.89 u[IU]/mL (ref 0.35–4.50)

## 2017-06-03 NOTE — Patient Instructions (Addendum)
Please return in 1-6 months for your complete physical. Come fasting.   We will call you with your lab results.   It was a pleasure meeting you today! Thank you for choosing [place] to meet your healthcare needs! I truly look forward to working with you. If you have any questions or concerns, please send me a message via Mychart or call the office at [phone].

## 2017-06-03 NOTE — Progress Notes (Signed)
Subjective  CC:  Chief Complaint  Patient presents with  . Establish Care    Patient is having heart palpitations     HPI: Jacqueline Sloan is a 52 y.o. female who presents to [place] at [facility] today to establish care with me as a new patient.  She has the following concerns or needs:   She has not had a primary care doctor for several years.  She was a former patient of Dr. Sherren Mocha.  She does follow with a rheumatologist for her myofascial pain and primary osteoarthritis.  Overall she does very well and feels healthy.  She is active.  She does admit to some stressors, she is the mother of 65 year old twin boys.  She works full-time for [employer] and enjoys her work.  She will be due for health maintenance visit  At this time she is concerned regarding about a 2-week history of a vague tightness in the chest.  She describes palpitations or fluttering but denies racing heartbeat, exertional chest pain, dyspnea, GERD symptoms.  Review of systems is positive for intermittent diarrhea and cramping associated with meals.  She does suffer from mild chronic anxiety for which she is on multiple medications.  She denies symptoms of panic attack.  She has no thyroid disorders.  I did review her most recent labs.  We updated and reviewed the patient's past history in detail and it is documented below.  Patient Active Problem List   Diagnosis Date Noted  . Major depression, recurrent, chronic ([place]) 06/03/2017  . Myofascial pain syndrome 04/18/2016  . Primary osteoarthritis of both hands 04/18/2016  . Primary osteoarthritis of both feet 04/18/2016  . Migraine without aura or status migrainosus 04/18/2016  . Primary insomnia 04/18/2016  . ALLERGIC RHINITIS 12/17/2006   Health Maintenance  Topic Date Due  . HIV Screening  09/16/1981  . PAP SMEAR  09/17/1987  . MAMMOGRAM  09/16/2016  . COLONOSCOPY  09/16/2016  . INFLUENZA VACCINE  02/03/2018 (Originally 11/06/2016)   . TETANUS/TDAP  05/03/2020   Immunization History  Administered Date(s) Administered  . Td 04/09/1999, 05/03/2010   Current Meds  Medication Sig  . ALPRAZolam (XANAX) 0.25 MG tablet TAKE 1 TABLET BY MOUTH EVERY DAY AS NEEDED  . cycloSPORINE (RESTASIS) 0.05 % ophthalmic emulsion as needed.  Marland Kitchen escitalopram (LEXAPRO) 20 MG tablet   . lamoTRIgine (LAMICTAL) 150 MG tablet TAKE 1 TABLET BY MOUTH TWICE A DAY  . meloxicam (MOBIC) 15 MG tablet TAKE 1 TABLET BY MOUTH EVERY DAY  . rizatriptan (MAXALT-MLT) 10 MG disintegrating tablet as needed.   . traMADol (ULTRAM) 50 MG tablet TAKE 1-2 TABLETS BY MOUTH TWICE A DAY AS NEEDED FOR PAIN  . zolpidem (AMBIEN) 5 MG tablet as needed.     Allergies: Patient is allergic to penicillins. Past Medical History Patient  has a past medical history of ADD (05/18/2007), ALLERGIC RHINITIS (12/17/2006), ANXIETY (12/17/2006), Depression, GEN OSTEOARTHROSIS INVOLVING MULTIPLE SITES (12/29/2008), and Migraine. Past Surgical History Patient  has a past surgical history that includes Cesarean section (2002) and Tonsilectomy, adenoidectomy, bilateral myringotomy and tubes (2014). Family History: Patient family history includes Arthritis in her maternal grandfather and mother; Colon cancer (age of onset: 51) in her maternal grandfather; Congestive Heart Failure in her mother; Healthy in her son and son; Hypertension in her mother; Rheum arthritis in her maternal grandmother. Social History:  Patient  reports that  has never smoked. she has never used smokeless tobacco. She reports that she  does not drink alcohol or use drugs.  Review of Systems: Constitutional: negative for fever or malaise Ophthalmic: negative for photophobia, double vision or loss of vision Cardiovascular: negative for chest pain, dyspnea on exertion, or new LE swelling Respiratory: negative for SOB or persistent cough Gastrointestinal: negative for abdominal pain, change in bowel habits or  melena Genitourinary: negative for dysuria or gross hematuria Musculoskeletal: negative for new gait disturbance or muscular weakness Integumentary: negative for new or persistent rashes Neurological: negative for TIA or stroke symptoms Psychiatric: negative for SI or delusions Allergic/Immunologic: negative for hives  Patient Care Team    Relationship Specialty Notifications Start End  Leamon Arnt, MD PCP - General Family Medicine  06/03/17     Objective  Vitals: BP 126/84   Pulse 81   Temp 98.2 F (36.8 C) (Oral)   Resp 16   Ht 5' 6.5" (1.689 m)   Wt 151 lb (68.5 kg)   SpO2 97%   BMI 24.01 kg/m  General:  Well developed, well nourished, no acute distress  Psych:  Alert and oriented,normal mood and affect HEENT:  Normocephalic, atraumatic, non-icteric sclera, PERRL, oropharynx is without mass or exudate, supple neck without adenopathy, mass or thyromegaly Cardiovascular:  RRR without gallop, rub or murmur, nondisplaced PMI Respiratory:  Good breath sounds bilaterally, CTAB with normal respiratory effort Gastrointestinal: normal bowel sounds, soft, non-tender, no noted masses. No HSM MSK: no deformities, contusions. Joints are without erythema or swelling Skin:  Warm, no rashes or suspicious lesions noted Neurologic:    Mental status is normal. Gross motor and sensory exams are normal. Normal gait EKG with normal sinus rhythm, normal EKG Assessment  1. Palpitations   2. Major depression, recurrent, chronic ([place])   3. Migraine without aura and without status migrainosus, not intractable   4. Myofascial pain syndrome      Plan   Palpitations: Check thyroid and blood count.  Reassured, normal EKG and low risk for cardiovascular disease.  More likely related to anxiety and/or dyspepsia.  Patient to monitor her symptoms.  Continue Xanax and consider H2 blocker or PPI.  Follow-up if unresolved  Chronic depression with anxiety well controlled on current  medications  Myofascial pain syndrome per Dr. Patrecia Pour Follow up:  Return in about 3 months (around 08/31/2017) for complete physical.  Commons side effects, risks, benefits, and alternatives for medications and treatment plan prescribed today were discussed, and the patient expressed understanding of the given instructions. Patient is instructed to call or message via MyChart if he/she has any questions or concerns regarding our treatment plan. No barriers to understanding were identified. We discussed Red Flag symptoms and signs in detail. Patient expressed understanding regarding what to do in case of urgent or emergency type symptoms.   Medication list was reconciled, printed and provided to the patient in AVS. Patient instructions and summary information was reviewed with the patient as documented in the AVS. This note was prepared with assistance of Dragon voice recognition software. Occasional wrong-word or sound-a-like substitutions may have occurred due to the inherent limitations of voice recognition software  Orders Placed This Encounter  Procedures  . TSH  . Lipid panel  . EKG 12-Lead   No orders of the defined types were placed in this encounter.

## 2017-06-04 ENCOUNTER — Encounter: Payer: Self-pay | Admitting: Family Medicine

## 2017-06-04 NOTE — Progress Notes (Signed)
Please call patient: I have reviewed his/her lab results. Normal thyroid and cholesterol levels. Monitor symptoms and follow up as discussed.  The 10-year ASCVD risk score Mikey Bussing DC Brooke Bonito., et al., 2013) is: 1.4%   Values used to calculate the score:     Age: 52 years     Sex: Female     Is Non-Hispanic African American: No     Diabetic: No     Tobacco smoker: No     Systolic Blood Pressure: 130 mmHg     Is BP treated: No     HDL Cholesterol: 48.8 mg/dL     Total Cholesterol: 199 mg/dL

## 2017-06-06 ENCOUNTER — Telehealth: Payer: Self-pay | Admitting: Emergency Medicine

## 2017-06-06 DIAGNOSIS — R0789 Other chest pain: Secondary | ICD-10-CM

## 2017-06-06 NOTE — Telephone Encounter (Signed)
Spoke with patient regarding not feeling better since her appt. Per Dr Jonni Sanger, could be underlying dyspepsia or related to anxiety, Patient request referral to Cardiology, advised patient that referral would be placed. Patient verbalized understanding. AP, LPN

## 2017-06-06 NOTE — Telephone Encounter (Signed)
Please notify patient.  As discussed at our visit, possible etiologies include dyspepsia or GI related, or possibly related to underlying anxiety.  We discussed monitoring of the symptoms or considering treatments.  If patient feels that these are not what is going on, then she can consider seeing a cardiologist and/or a gastroenterologist.  I will make referrals if that is what she wants.  Otherwise, I recommended monitoring her symptoms and treating her possible dyspepsia with antacids.  Let me know what she like me to do.

## 2017-06-06 NOTE — Telephone Encounter (Signed)
Patient is calling checking status, would like a referral to see a cardiologists. Please advise, call back [phone]

## 2017-06-06 NOTE — Telephone Encounter (Signed)
Dr. Jonni Sanger,   Please see CRM. And advise   Copied from [place] [id number]. Topic: Referral - Request >> Jun 05, 2017  5:18 PM Oliver Pila B wrote: Reason for CRM: pt would like a referral to a physician that could help identify what may be going on w/ the pt, pt states she saw pcp and had test done but no diagnosis was found, contact pt to advise

## 2017-06-17 ENCOUNTER — Encounter: Payer: Self-pay | Admitting: Cardiology

## 2017-06-18 DIAGNOSIS — F3342 Major depressive disorder, recurrent, in full remission: Secondary | ICD-10-CM | POA: Diagnosis not present

## 2017-06-23 ENCOUNTER — Ambulatory Visit: Payer: Federal, State, Local not specified - PPO | Admitting: Cardiology

## 2017-06-24 ENCOUNTER — Encounter: Payer: Self-pay | Admitting: Interventional Cardiology

## 2017-06-24 ENCOUNTER — Ambulatory Visit: Payer: Federal, State, Local not specified - PPO | Admitting: Interventional Cardiology

## 2017-06-24 VITALS — BP 98/70 | HR 73 | Ht 66.5 in | Wt 150.0 lb

## 2017-06-24 DIAGNOSIS — R072 Precordial pain: Secondary | ICD-10-CM

## 2017-06-24 DIAGNOSIS — R002 Palpitations: Secondary | ICD-10-CM | POA: Diagnosis not present

## 2017-06-24 NOTE — Patient Instructions (Signed)
Medication Instructions:  Your physician recommends that you continue on your current medications as directed. Please refer to the Current Medication list given to you today.   Labwork: None ordered  Testing/Procedures: Your physician has recommended that you wear a holter monitor. Holter monitors are medical devices that record the heart's electrical activity. Doctors most often use these monitors to diagnose arrhythmias. Arrhythmias are problems with the speed or rhythm of the heartbeat. The monitor is a small, portable device. You can wear one while you do your normal daily activities. This is usually used to diagnose what is causing palpitations/syncope (passing out).  Your physician has requested that you have an exercise tolerance test. For further information please visit [email or web address].[email or web address]. Please also follow instruction sheet, as given.   Follow-Up: Based on test results   Any Other Special Instructions Will Be Listed Below (If Applicable).   Exercise Stress Electrocardiogram An exercise stress electrocardiogram is a test to check how blood flows to your heart. It is done to find areas of poor blood flow. You will need to walk on a treadmill for this test. The electrocardiogram will record your heartbeat when you are at rest and when you are exercising. What happens before the procedure?  Do not have drinks with caffeine or foods with caffeine for 24 hours before the test, or as told by your doctor. This includes coffee, tea (even decaf tea), sodas, chocolate, and cocoa.  Follow your doctor's instructions about eating and drinking before the test.  Ask your doctor what medicines you should or should not take before the test. Take your medicines with water unless told by your doctor not to.  If you use an inhaler, bring it with you to the test.  Bring a snack to eat after the test.  Do not  smoke for 4 hours before the test.  Do not put lotions, powders, creams, or oils  on your chest before the test.  Wear comfortable shoes and clothing. What happens during the procedure?  You will have patches put on your chest. Small areas of your chest may need to be shaved. Wires will be connected to the patches.  Your heart rate will be watched while you are resting and while you are exercising.  You will walk on the treadmill. The treadmill will slowly get faster to raise your heart rate.  The test will take about 1-2 hours. What happens after the procedure?  Your heart rate and blood pressure will be watched after the test.  You may return to your normal diet, activities, and medicines or as told by your doctor. This information is not intended to replace advice given to you by your health care provider. Make sure you discuss any questions you have with your health care provider. Document Released: 09/11/2007 Document Revised: 11/22/2015 Document Reviewed: 11/30/2012 Elsevier Interactive Patient Education  2018 [place].  Holter Monitoring A Holter monitor is a small device that is used to detect abnormal heart rhythms. It clips to your clothing and is connected by wires to flat, sticky disks (electrodes) that attach to your chest. It is worn continuously for 24-48 hours. Follow these instructions at home:  Wear your Holter monitor at all times, even while exercising and sleeping, for as long as directed by your health care provider.  Make sure that the Holter monitor is safely clipped to your clothing or close to your body as recommended by your health care provider.  Do not get the monitor or wires wet.  Do not put body lotion or moisturizer on your chest.  Keep your skin clean.  Keep a diary of your daily activities, such as walking and doing chores. If you feel that your heartbeat is abnormal or that your heart is fluttering or skipping a beat: ? Record what you are doing when it happens. ? Record what time of day the symptoms occur.  Return  your Holter monitor as directed by your health care provider.  Keep all follow-up visits as directed by your health care provider. This is important. Get help right away if:  You feel lightheaded or you faint.  You have trouble breathing.  You feel pain in your chest, upper arm, or jaw.  You feel sick to your stomach and your skin is pale, cool, or damp.  You heartbeat feels unusual or abnormal. This information is not intended to replace advice given to you by your health care provider. Make sure you discuss any questions you have with your health care provider. Document Released: 12/22/2003 Document Revised: 08/31/2015 Document Reviewed: 11/01/2013 Elsevier Interactive Patient Education  [employer].    If you need a refill on your cardiac medications before your next appointment, please call your pharmacy.

## 2017-06-24 NOTE — Progress Notes (Signed)
Cardiology Office Note   Date:  06/24/2017   ID:  Jacqueline Sloan, DOB 1965/06/22, MRN [id number]  PCP:  Leamon Arnt, MD    No chief complaint on file.  palpitations  Wt Readings from Last 3 Encounters:  06/24/17 150 lb (68 kg)  06/03/17 151 lb (68.5 kg)  03/05/17 147 lb (66.7 kg)       History of Present Illness: Jacqueline Sloan is a 52 y.o. female who is being seen today for the evaluation of palpitations and chest pressure at the request of Leamon Arnt, MD.  She has had a sensation for 3 weeks, that she feels SHOB coming from nervousness.  She notes a pressure in her chest, which is worse with stress. She decreased caffeine and increased water intake, but no relief of sx.  It feels like a "scraping" in her chest.  Racing  Mother had CHF and some tachycardia at menopause.    Walking up stairs is most strenuous activity. No CP with that.  SHe does feel winded in the last few weeks.       Past Medical History:  Diagnosis Date  . ADD 05/18/2007  . ALLERGIC RHINITIS 12/17/2006  . ANXIETY 12/17/2006  . Depression   . GEN OSTEOARTHROSIS INVOLVING MULTIPLE SITES 12/29/2008  . Major depression, recurrent, chronic ([place]) 06/03/2017   Managed by Noemi Chapel, FNP psych  . Migraine   . Migraine without aura or status migrainosus 04/18/2016   Preventive with lamictal  . Myofascial pain syndrome 04/18/2016  . Nausea with vomiting 02/28/2008   Qualifier: Diagnosis of  By: Sherren Mocha MD, Jory Ee   . Other fatigue 04/18/2016  . Primary insomnia 04/18/2016  . Primary osteoarthritis of both feet 04/18/2016  . Primary osteoarthritis of both hands 04/18/2016  . Viral URI 02/11/2011    Past Surgical History:  Procedure Laterality Date  . CESAREAN SECTION  2002  . TONSILECTOMY, ADENOIDECTOMY, BILATERAL MYRINGOTOMY AND TUBES  2014     Current Outpatient Medications  Medication Sig Dispense Refill  . ALPRAZolam (XANAX) 0.25 MG tablet Take 0.25 mg by mouth at bedtime as needed for  anxiety.    . cycloSPORINE (RESTASIS) 0.05 % ophthalmic emulsion as needed.    Marland Kitchen escitalopram (LEXAPRO) 20 MG tablet     . lamoTRIgine (LAMICTAL) 150 MG tablet Take 150 mg by mouth daily.    . meloxicam (MOBIC) 15 MG tablet TAKE 1 TABLET BY MOUTH EVERY DAY 90 tablet 0  . rizatriptan (MAXALT-MLT) 10 MG disintegrating tablet as needed.     . traMADol (ULTRAM) 50 MG tablet Take 50-100 mg by mouth every 6 (six) hours as needed for moderate pain or severe pain.    Marland Kitchen zolpidem (AMBIEN) 5 MG tablet as needed.      No current facility-administered medications for this visit.     Allergies:   Penicillins    Social History:  The patient  reports that  has never smoked. she has never used smokeless tobacco. She reports that she does not drink alcohol or use drugs.   Family History:  The patient's family history includes Arthritis in her maternal grandfather and mother; Colon cancer (age of onset: 41) in her maternal grandfather; Congestive Heart Failure in her mother; Healthy in her son and son; Hypertension in her mother; Rheum arthritis in her maternal grandmother.    ROS:  Please see the history of present illness.   Otherwise, review of systems are positive for chest pressure, palpitations.  All other systems are reviewed and negative.    PHYSICAL EXAM: VS:  BP 98/70 (BP Location: Left Arm, Patient Position: Sitting, Cuff Size: Normal)   Pulse 73   Ht 5' 6.5" (1.689 m)   Wt 150 lb (68 kg)   SpO2 96%   BMI 23.85 kg/m  , BMI Body mass index is 23.85 kg/m. GEN: Well nourished, well developed, in no acute distress  HEENT: normal  Neck: no JVD, carotid bruits, or masses Cardiac: RRR; no murmurs, rubs, or gallops,no edema  Respiratory:  clear to auscultation bilaterally, normal work of breathing GI: soft, nontender, nondistended, + BS MS: no deformity or atrophy  Skin: warm and dry, no rash Neuro:  Strength and sensation are intact Psych: euthymic mood, full affect   EKG:   The ekg  ordered in 2/19 demonstrates NSR, no ST changes   Recent Labs: 05/14/2017: ALT 18; BUN 13; Creat 0.66; Hemoglobin 12.9; Platelets 290; Potassium 4.0; Sodium 141 06/03/2017: TSH 0.89   Lipid Panel    Component Value Date/Time   CHOL 199 06/03/2017 1511   TRIG 117.0 06/03/2017 1511   HDL 48.80 06/03/2017 1511   CHOLHDL 4 06/03/2017 1511   VLDL 23.4 06/03/2017 1511   LDLCALC 126 (H) 06/03/2017 1511     Other studies Reviewed: Additional studies/ records that were reviewed today with results demonstrating: TSH 0.89 in 2/19.   ASSESSMENT AND PLAN:  1. Chest pain: Atypical.  Check ETT to eval for ischemia.  Low pretest probability of disease.  Consider further testing if ETT is abnormal.  No evidence of structural heart disease on exam.  2. Palpitations: 24 hr Holter.  She has palpitations daily.  She already minimizes caffeine. 3. LDL adequately controlled for degree of RF. Longterm, will benefit from exercise and healthy diet.     Current medicines are reviewed at length with the patient today.  The patient concerns regarding her medicines were addressed.  The following changes have been made:  No change  Labs/ tests ordered today include:  No orders of the defined types were placed in this encounter.   Recommend 150 minutes/week of aerobic exercise Low fat, low carb, high fiber diet recommended  Disposition:   FU based on test results   Signed, Larae Grooms, MD  06/24/2017 4:26 PM    [facility] [place], [place], [place]  [address] Phone: [phone]; Fax: [phone]

## 2017-07-08 ENCOUNTER — Other Ambulatory Visit: Payer: Self-pay | Admitting: Interventional Cardiology

## 2017-07-08 ENCOUNTER — Ambulatory Visit (INDEPENDENT_AMBULATORY_CARE_PROVIDER_SITE_OTHER): Payer: Federal, State, Local not specified - PPO

## 2017-07-08 DIAGNOSIS — R002 Palpitations: Secondary | ICD-10-CM | POA: Diagnosis not present

## 2017-07-08 DIAGNOSIS — R072 Precordial pain: Secondary | ICD-10-CM | POA: Diagnosis not present

## 2017-07-08 LAB — EXERCISE TOLERANCE TEST
CHL RATE OF PERCEIVED EXERTION: 16
CSEPED: 6 min
CSEPEDS: 0 s
CSEPHR: 95 %
Estimated workload: 7 METS
MPHR: 170 {beats}/min
Peak HR: 162 {beats}/min
Rest HR: 72 {beats}/min

## 2017-08-04 ENCOUNTER — Other Ambulatory Visit: Payer: Self-pay | Admitting: Rheumatology

## 2017-08-05 ENCOUNTER — Telehealth: Payer: Self-pay | Admitting: Rheumatology

## 2017-08-05 NOTE — Telephone Encounter (Addendum)
Last visit: 03/05/2017 Next visit: 09/03/2017 UDS: 10/16/2016 Narc agreement: 03/05/2017  Patient advised she needs to update UDS and will update tomorrow 08/06/17.  OKay to refill tramadol?

## 2017-08-05 NOTE — Telephone Encounter (Signed)
Attempted to contact the patient and left message for patient to call the office.  

## 2017-08-05 NOTE — Telephone Encounter (Signed)
To refill after UDS

## 2017-08-05 NOTE — Telephone Encounter (Signed)
Patient is returning a call to our office with questions on the UDS for her Tramadol.  Patient states she was in for labs on 05/14/17 and wants to know why the test wasn't performed at that time.

## 2017-08-06 ENCOUNTER — Other Ambulatory Visit: Payer: Self-pay

## 2017-08-06 ENCOUNTER — Other Ambulatory Visit: Payer: Self-pay | Admitting: *Deleted

## 2017-08-06 DIAGNOSIS — Z5181 Encounter for therapeutic drug level monitoring: Secondary | ICD-10-CM

## 2017-08-06 DIAGNOSIS — F3342 Major depressive disorder, recurrent, in full remission: Secondary | ICD-10-CM | POA: Diagnosis not present

## 2017-08-07 LAB — PAIN MGMT, PROFILE 5 W/CONF, U
AMPHETAMINES: NEGATIVE ng/mL (ref <500)
BENZODIAZEPINES: NEGATIVE ng/mL (ref <100)
Barbiturates: NEGATIVE ng/mL (ref <300)
COCAINE METABOLITE: NEGATIVE ng/mL (ref <150)
CREATININE: 229 mg/dL
MARIJUANA METABOLITE: NEGATIVE ng/mL (ref <20)
Methadone Metabolite: NEGATIVE ng/mL (ref <100)
OPIATES: NEGATIVE ng/mL (ref <100)
OXIDANT: NEGATIVE ug/mL (ref <200)
Oxycodone: NEGATIVE ng/mL (ref <100)
pH: 6.83 (ref 4.5–9.0)

## 2017-08-08 LAB — PAIN MGMT, TRAMADOL W/MEDMATCH, U
DESMETHYLTRAMADOL: 657 ng/mL — AB (ref <100)
TRAMADOL: 1250 ng/mL — AB (ref <100)

## 2017-08-20 NOTE — Progress Notes (Signed)
Office Visit Note  Patient: Jacqueline Sloan             Date of Birth: 1965-07-10           MRN: [id number]             PCP: Leamon Arnt, MD Referring: No ref. provider found Visit Date: 09/03/2017 Occupation: @GUAROCC @    Subjective:  Hand stiffness    History of Present Illness: Jacqueline Sloan is a 52 y.o. female with history of myofascial pain syndrome and osteoarthritis.  She denies any muscle aches or muscle tenderness.  She feels her fatigue has been stable.  Her insomnia is improved with Ambien 5 mg at bedtime.  She would like to start lowering her dose of Ambien.  She takes Mobic 15 mg every other day and Tramadol 1 tablet by mouth daily.   She states she has had some increased hand stiffness but denies any hand pain or swelling.  She denies any other joint pain or joint swelling.     Activities of Daily Living:  Patient reports morning stiffness for 1 hour.   Patient Denies nocturnal pain.  Difficulty dressing/grooming: Denies Difficulty climbing stairs: Denies Difficulty getting out of chair: Denies Difficulty using hands for taps, buttons, cutlery, and/or writing: Denies   Review of Systems  Constitutional: Positive for fatigue.  HENT: Negative for mouth sores, mouth dryness and nose dryness.   Eyes: Negative for pain, visual disturbance and dryness.  Respiratory: Negative for cough, hemoptysis, shortness of breath and difficulty breathing.   Cardiovascular: Negative for chest pain, palpitations, hypertension and swelling in legs/feet.  Gastrointestinal: Positive for diarrhea. Negative for blood in stool and constipation.  Endocrine: Negative for excessive thirst and increased urination.  Genitourinary: Negative for difficulty urinating and painful urination.  Musculoskeletal: Positive for morning stiffness. Negative for arthralgias, joint pain, joint swelling, myalgias, muscle weakness, muscle tenderness and myalgias.  Skin: Negative for color change, pallor,  rash, hair loss, nodules/bumps, skin tightness, ulcers and sensitivity to sunlight.  Allergic/Immunologic: Negative for susceptible to infections.  Neurological: Negative for dizziness, numbness, headaches and weakness.  Hematological: Negative for bruising/bleeding tendency and swollen glands.  Psychiatric/Behavioral: Positive for sleep disturbance. Negative for depressed mood. The patient is not nervous/anxious.     PMFS History:  Patient Active Problem List   Diagnosis Date Noted  . Major depression, recurrent, chronic ([place]) 06/03/2017  . Myofascial pain syndrome 04/18/2016  . Primary osteoarthritis of both hands 04/18/2016  . Primary osteoarthritis of both feet 04/18/2016  . Migraine without aura or status migrainosus 04/18/2016  . Primary insomnia 04/18/2016  . ALLERGIC RHINITIS 12/17/2006    Past Medical History:  Diagnosis Date  . ADD 05/18/2007  . ALLERGIC RHINITIS 12/17/2006  . ANXIETY 12/17/2006  . Depression   . GEN OSTEOARTHROSIS INVOLVING MULTIPLE SITES 12/29/2008  . Major depression, recurrent, chronic ([place]) 06/03/2017   Managed by Noemi Chapel, FNP psych  . Migraine   . Migraine without aura or status migrainosus 04/18/2016   Preventive with lamictal  . Myofascial pain syndrome 04/18/2016  . Nausea with vomiting 02/28/2008   Qualifier: Diagnosis of  By: Sherren Mocha MD, Jory Ee   . Other fatigue 04/18/2016  . Primary insomnia 04/18/2016  . Primary osteoarthritis of both feet 04/18/2016  . Primary osteoarthritis of both hands 04/18/2016  . Viral URI 02/11/2011    Family History  Problem Relation Age of Onset  . Hypertension Mother   . Congestive Heart Failure Mother   .  Arthritis Mother   . Healthy Son   . Healthy Son   . Rheum arthritis Maternal Grandmother   . Arthritis Maternal Grandfather   . Colon cancer Maternal Grandfather 75  . Cancer Neg Hx        family hx endometrial ca   Past Surgical History:  Procedure Laterality Date  . CESAREAN SECTION  2002  .  TONSILECTOMY, ADENOIDECTOMY, BILATERAL MYRINGOTOMY AND TUBES  2014   Social History   Social History Narrative  . Not on file     Objective: Vital Signs: BP 105/66 (BP Location: Left Arm, Patient Position: Sitting, Cuff Size: Normal)   Pulse 81   Resp 14   Ht 5' 6.5" (1.689 m)   Wt 152 lb (68.9 kg)   LMP 08/26/2017   BMI 24.17 kg/m    Physical Exam  Constitutional: She is oriented to person, place, and time. She appears well-developed and well-nourished.  HENT:  Head: Normocephalic and atraumatic.  Eyes: Conjunctivae and EOM are normal.  Neck: Normal range of motion.  Cardiovascular: Normal rate, regular rhythm, normal heart sounds and intact distal pulses.  Pulmonary/Chest: Effort normal and breath sounds normal.  Abdominal: Soft. Bowel sounds are normal.  Lymphadenopathy:    She has no cervical adenopathy.  Neurological: She is alert and oriented to person, place, and time.  Skin: Skin is warm and dry. Capillary refill takes less than 2 seconds.  Psychiatric: She has a normal mood and affect. Her behavior is normal.  Nursing note and vitals reviewed.    Musculoskeletal Exam: C-spine, thoracic spine, lumbar spine good ROM.  No midline spinal tenderness.  No SI joint tenderness.  Shoulder joints, elbow joints, wrist joints, MCPs, PIPs, and DIPs good ROM with no synovitis.  Hip joints, knee joints, ankle joints, MTPs, PIPs ,and DIPs good ROM with no synovitis.  No warmth or effusion of knee joints.  No tenderness of trochanteric bursa.  No tender points today.  CDAI Exam: No CDAI exam completed.    Investigation: No additional findings. CBC Latest Ref Rng & Units 05/14/2017 10/16/2016 05/15/2016  WBC 3.8 - 10.8 Thousand/uL 6.0 5.7 8.6  Hemoglobin 11.7 - 15.5 g/dL 12.9 13.3 13.3  Hematocrit 35.0 - 45.0 % 37.6 40.1 39.9  Platelets 140 - 400 Thousand/uL 290 283 328   CMP Latest Ref Rng & Units 05/14/2017 10/16/2016 05/15/2016  Glucose 65 - 99 mg/dL 84 89 117(H)  BUN 7 - 25 mg/dL  13 11 14   Creatinine 0.50 - 1.05 mg/dL 0.66 0.66 0.72  Sodium 135 - 146 mmol/L 141 137 139  Potassium 3.5 - 5.3 mmol/L 4.0 4.2 3.8  Chloride 98 - 110 mmol/L 106 104 105  CO2 20 - 32 mmol/L 30 22 27   Calcium 8.6 - 10.4 mg/dL 9.3 8.9 9.7  Total Protein 6.1 - 8.1 g/dL 6.7 6.7 7.0  Total Bilirubin 0.2 - 1.2 mg/dL 0.5 0.4 0.6  Alkaline Phos 33 - 130 U/L - 50 50  AST 10 - 35 U/L 22 18 16   ALT 6 - 29 U/L 18 11 11     Imaging: No results found.  Speciality Comments: No specialty comments available.    Procedures:  No procedures performed Allergies: Penicillins   Assessment / Plan:     Visit Diagnoses: Myofascial pain syndrome -She has no generalized muscle aches or tenderness.  She has clinically been doing well.  She has been spacing out her doses of Mobic 15 mg to every other day.  She is going to  start taking Mobic on a PRN basis.  She has also only been taking Tramadol 50 mg 1 tablet daily for pain relief.  She is also taking Ambien 5 mg at bedtime.  We discussed cutting her tablets in half and taking 2.5 mg at bedtime.  We also discussed good sleep hygiene.  She is going to try to start exercising on a regular basis.  UDS: 08/06/2017. Narc agreement: 03/05/2017  Primary osteoarthritis of both hands: She has been having increased stiffness in bilateral hands.  No joint pain or joint swelling at this time.  She has no synovitis on exam.  Joint protection and muscle strengthening were discussed.   Primary osteoarthritis of both feet:  She wears proper fitting shoes. She has no discomfort in her feet at this time.   Primary insomnia: Improved.  She takes Ambien 5 mg at bedtime.  She would like to start tapering her dose and try to discontinue Ambien.  We suggested she cut the tablets in half.  We also discussed good sleep hygiene habits.  She is going to start drinking sleepy time tea/   Other fatigue: Stable.  She was encouraged to start exercising on a regular basis and taking frequent  breaks.   Other medical conditions are listed as follows:   History of anxiety  History of depression  History of migraine    Orders: No orders of the defined types were placed in this encounter.  No orders of the defined types were placed in this encounter.    Follow-Up Instructions: Return in about 6 months (around 03/06/2018) for Myofascial pain syndrome , Osteoarthritis.   Ofilia Neas, PA-C   I examined and evaluated the patient with Hazel Sams PA.  And had no synovitis on my examination.  She continues to have some discomfort in her joints due to underlying osteoarthritis.  He also had some generalized discomfort from myofascial pain syndrome.  The plan of care was discussed as noted above.  Bo Merino, MD Note - This record has been created using Editor, commissioning.  Chart creation errors have been sought, but may not always  have been located. Such creation errors do not reflect on  the standard of medical care.

## 2017-08-22 ENCOUNTER — Other Ambulatory Visit: Payer: Self-pay | Admitting: Rheumatology

## 2017-08-22 NOTE — Telephone Encounter (Signed)
Last visit: 03/05/2017 Next visit: 09/03/2017 UDS: 08/06/17 Narc agreement: 03/05/2017  Okay to refill Tramadol?

## 2017-08-27 DIAGNOSIS — F3342 Major depressive disorder, recurrent, in full remission: Secondary | ICD-10-CM | POA: Diagnosis not present

## 2017-08-29 DIAGNOSIS — H16221 Keratoconjunctivitis sicca, not specified as Sjogren's, right eye: Secondary | ICD-10-CM | POA: Diagnosis not present

## 2017-09-03 ENCOUNTER — Encounter: Payer: Self-pay | Admitting: Rheumatology

## 2017-09-03 ENCOUNTER — Ambulatory Visit: Payer: Federal, State, Local not specified - PPO | Admitting: Rheumatology

## 2017-09-03 VITALS — BP 105/66 | HR 81 | Resp 14 | Ht 66.5 in | Wt 152.0 lb

## 2017-09-03 DIAGNOSIS — M7918 Myalgia, other site: Secondary | ICD-10-CM

## 2017-09-03 DIAGNOSIS — Z8669 Personal history of other diseases of the nervous system and sense organs: Secondary | ICD-10-CM

## 2017-09-03 DIAGNOSIS — M19071 Primary osteoarthritis, right ankle and foot: Secondary | ICD-10-CM

## 2017-09-03 DIAGNOSIS — Z8659 Personal history of other mental and behavioral disorders: Secondary | ICD-10-CM | POA: Diagnosis not present

## 2017-09-03 DIAGNOSIS — F5101 Primary insomnia: Secondary | ICD-10-CM

## 2017-09-03 DIAGNOSIS — M19041 Primary osteoarthritis, right hand: Secondary | ICD-10-CM | POA: Diagnosis not present

## 2017-09-03 DIAGNOSIS — M19042 Primary osteoarthritis, left hand: Secondary | ICD-10-CM

## 2017-09-03 DIAGNOSIS — M19072 Primary osteoarthritis, left ankle and foot: Secondary | ICD-10-CM | POA: Diagnosis not present

## 2017-09-03 DIAGNOSIS — R5383 Other fatigue: Secondary | ICD-10-CM

## 2017-09-03 DIAGNOSIS — K080 Exfoliation of teeth due to systemic causes: Secondary | ICD-10-CM | POA: Diagnosis not present

## 2017-09-07 ENCOUNTER — Other Ambulatory Visit: Payer: Self-pay | Admitting: Rheumatology

## 2017-09-08 NOTE — Telephone Encounter (Signed)
Last Visit: 09/03/17 Next visit: 02/06/18 Labs: 05/14/17 WNL  Okay to refill per Dr. Estanislado Pandy

## 2017-09-23 DIAGNOSIS — Z1231 Encounter for screening mammogram for malignant neoplasm of breast: Secondary | ICD-10-CM | POA: Diagnosis not present

## 2017-09-26 DIAGNOSIS — F3342 Major depressive disorder, recurrent, in full remission: Secondary | ICD-10-CM | POA: Diagnosis not present

## 2017-10-24 DIAGNOSIS — F3342 Major depressive disorder, recurrent, in full remission: Secondary | ICD-10-CM | POA: Diagnosis not present

## 2017-10-26 ENCOUNTER — Other Ambulatory Visit: Payer: Self-pay | Admitting: Physician Assistant

## 2017-10-27 NOTE — Telephone Encounter (Signed)
Last Visit: 09/03/17 Next visit: 02/06/18 UDS: 08/06/17 Narc Agreement: 03/05/17  Okay to refill Tramadol?

## 2017-11-12 DIAGNOSIS — F330 Major depressive disorder, recurrent, mild: Secondary | ICD-10-CM | POA: Diagnosis not present

## 2017-12-17 DIAGNOSIS — F330 Major depressive disorder, recurrent, mild: Secondary | ICD-10-CM | POA: Diagnosis not present

## 2017-12-24 DIAGNOSIS — Z1151 Encounter for screening for human papillomavirus (HPV): Secondary | ICD-10-CM | POA: Diagnosis not present

## 2017-12-24 DIAGNOSIS — Z01419 Encounter for gynecological examination (general) (routine) without abnormal findings: Secondary | ICD-10-CM | POA: Diagnosis not present

## 2017-12-24 DIAGNOSIS — Z6824 Body mass index (BMI) 24.0-24.9, adult: Secondary | ICD-10-CM | POA: Diagnosis not present

## 2018-01-17 ENCOUNTER — Other Ambulatory Visit: Payer: Self-pay | Admitting: Rheumatology

## 2018-01-19 NOTE — Telephone Encounter (Signed)
Last Visit: 09/03/17 Next visit: 02/06/18 UDS: 08/06/17 Narc Agreement: 03/05/17  Last Fill: 10/27/17  Noted next appointment (02/06/18) to update UDS and narc agreement  Okay to refill Tramadol?

## 2018-01-21 DIAGNOSIS — F330 Major depressive disorder, recurrent, mild: Secondary | ICD-10-CM | POA: Diagnosis not present

## 2018-01-26 NOTE — Progress Notes (Signed)
Office Visit Note  Patient: Jacqueline Sloan             Date of Birth: 11/26/1965           MRN: [id number]             PCP: Leamon Arnt, MD Referring: Leamon Arnt, MD Visit Date: 02/06/2018 Occupation: @GUAROCC @  Subjective:  Pain in multiple joints.   History of Present Illness: Jacqueline Sloan is a 52 y.o. female with history of myofascial pain syndrome osteoarthritis.  She states she continues to have pain and discomfort in multiple joints.  She complains of pain in her bilateral hands bilateral knees and bilateral feet.  She also has some lower back pain.  She has been taking tramadol tablet p.o. every morning which controlled her symptoms quite well.  She has been taking Ambien 5 mg p.o. nightly which helps her with insomnia.  She is still taking Mobic 15 mg p.o. daily.  Activities of Daily Living:  Patient reports morning stiffness for 5-10 minutes.   Patient Reports nocturnal pain.  Difficulty dressing/grooming: Denies Difficulty climbing stairs: Denies Difficulty getting out of chair: Denies Difficulty using hands for taps, buttons, cutlery, and/or writing: Reports  Review of Systems  Constitutional: Positive for fatigue.  HENT: Negative for mouth sores, trouble swallowing, trouble swallowing and mouth dryness.   Eyes: Negative for pain, redness, itching and dryness.  Respiratory: Negative for shortness of breath, wheezing and difficulty breathing.   Cardiovascular: Negative for chest pain, palpitations and swelling in legs/feet.  Gastrointestinal: Negative for abdominal pain, constipation, diarrhea, nausea and vomiting.  Endocrine: Negative for increased urination.  Genitourinary: Negative for painful urination, nocturia and pelvic pain.  Musculoskeletal: Positive for arthralgias, joint pain and morning stiffness. Negative for joint swelling.  Skin: Negative for rash and hair loss.  Allergic/Immunologic: Negative for susceptible to infections.  Neurological:  Positive for memory loss. Negative for dizziness, light-headedness, headaches and weakness.  Hematological: Negative for bruising/bleeding tendency.  Psychiatric/Behavioral: Negative for confusion. The patient is not nervous/anxious.     PMFS History:  Patient Active Problem List   Diagnosis Date Noted  . Major depression, recurrent, chronic ([place]) 06/03/2017  . Myofascial pain syndrome 04/18/2016  . Primary osteoarthritis of both hands 04/18/2016  . Primary osteoarthritis of both feet 04/18/2016  . Migraine without aura or status migrainosus 04/18/2016  . Primary insomnia 04/18/2016  . ALLERGIC RHINITIS 12/17/2006    Past Medical History:  Diagnosis Date  . ADD 05/18/2007  . ALLERGIC RHINITIS 12/17/2006  . ANXIETY 12/17/2006  . Depression   . GEN OSTEOARTHROSIS INVOLVING MULTIPLE SITES 12/29/2008  . Major depression, recurrent, chronic ([place]) 06/03/2017   Managed by Noemi Chapel, FNP psych  . Migraine   . Migraine without aura or status migrainosus 04/18/2016   Preventive with lamictal  . Myofascial pain syndrome 04/18/2016  . Nausea with vomiting 02/28/2008   Qualifier: Diagnosis of  By: Sherren Mocha MD, Jory Ee   . Other fatigue 04/18/2016  . Primary insomnia 04/18/2016  . Primary osteoarthritis of both feet 04/18/2016  . Primary osteoarthritis of both hands 04/18/2016  . Viral URI 02/11/2011    Family History  Problem Relation Age of Onset  . Hypertension Mother   . Congestive Heart Failure Mother   . Arthritis Mother   . Healthy Son   . Healthy Son   . Rheum arthritis Maternal Grandmother   . Arthritis Maternal Grandfather   . Colon cancer Maternal Grandfather 39  .  Cancer Neg Hx        family hx endometrial ca   Past Surgical History:  Procedure Laterality Date  . CESAREAN SECTION  2002  . TONSILECTOMY, ADENOIDECTOMY, BILATERAL MYRINGOTOMY AND TUBES  2014   Social History   Social History Narrative  . Not on file    Objective: Vital Signs: BP 107/64 (BP Location: Left  Arm, Patient Position: Sitting, Cuff Size: Normal)   Pulse 75   Resp 12   Ht 5' 6.5" (1.689 m)   Wt 157 lb 3.2 oz (71.3 kg)   BMI 24.99 kg/m    Physical Exam  Constitutional: She is oriented to person, place, and time. She appears well-developed and well-nourished.  HENT:  Head: Normocephalic and atraumatic.  Eyes: Conjunctivae and EOM are normal.  Neck: Normal range of motion.  Cardiovascular: Normal rate, regular rhythm, normal heart sounds and intact distal pulses.  Pulmonary/Chest: Effort normal and breath sounds normal.  Abdominal: Soft. Bowel sounds are normal.  Lymphadenopathy:    She has no cervical adenopathy.  Neurological: She is alert and oriented to person, place, and time.  Skin: Skin is warm and dry. Capillary refill takes less than 2 seconds.  Psychiatric: She has a normal mood and affect. Her behavior is normal.  Nursing note and vitals reviewed.    Musculoskeletal Exam: C-spine thoracic lumbar spine good range of motion.  She had bilateral trapezius spasm.  Shoulder joints elbow joints wrist joint MCPs PIPs DIPs were in good range of motion with no synovitis.  Hip joints knee joints ankles MTPs PIPs DIPs were in good range of motion with no synovitis.  She has some osteoarthritic changes in her hands and feet with PIP and DIP thickening.  She also has bilateral pes planus.  CDAI Exam: CDAI Score: Not documented Patient Global Assessment: Not documented; Provider Global Assessment: Not documented Swollen: Not documented; Tender: Not documented Joint Exam   Not documented   There is currently no information documented on the homunculus. Go to the Rheumatology activity and complete the homunculus joint exam.  Investigation: No additional findings.  Imaging: No results found.  Recent Labs: Lab Results  Component Value Date   WBC 6.0 05/14/2017   HGB 12.9 05/14/2017   PLT 290 05/14/2017   NA 141 05/14/2017   K 4.0 05/14/2017   CL 106 05/14/2017   CO2  30 05/14/2017   GLUCOSE 84 05/14/2017   BUN 13 05/14/2017   CREATININE 0.66 05/14/2017   BILITOT 0.5 05/14/2017   ALKPHOS 50 10/16/2016   AST 22 05/14/2017   ALT 18 05/14/2017   PROT 6.7 05/14/2017   ALBUMIN 4.2 10/16/2016   CALCIUM 9.3 05/14/2017   GFRAA 119 05/14/2017    Speciality Comments: No specialty comments available.  Procedures:  No procedures performed Allergies: Penicillins   Assessment / Plan:     Visit Diagnoses: Myofascial pain syndrome -patient continues to have some generalized pain and discomfort.  She states she takes tramadol 1 tablet p.o. every morning which controlled her symptoms quite well.  She is also on Mobic 15 mg a day which has been helpful.  Need for regular exercise was emphasized.  We will renew her narcotic agreement and UDS today.  I will also check CBC and CMP as she is on long-term NSAIDs.    Primary osteoarthritis of both hands-she continues to have pain and stiffness in her bilateral hands.  Joint protection muscle strengthening was discussed.  Primary osteoarthritis of both feet-she has ongoing pain  and discomfort in her bilateral feet.  She has bilateral pes planus.  Proper fitting shoes with arch support were discussed.  Other fatigue-related to fibromyalgia and insomnia.  Primary insomnia - Ambien 5 mg p.o. nightly helps been helpful for her.  History of migraine-she states her migraines are controlled currently.  History of anxiety  History of depression-she is on Cymbalta.  Medication monitoring encounter - Plan: Pain Mgmt, Profile 5 w/Conf, U, Pain Mgmt, Tramadol w/medMATCH, U    Orders: Orders Placed This Encounter  Procedures  . Pain Mgmt, Profile 5 w/Conf, U  . Pain Mgmt, Tramadol w/medMATCH, U  . CBC with Differential/Platelet  . COMPLETE METABOLIC PANEL WITH GFR   No orders of the defined types were placed in this encounter.   Face-to-face time spent with patient was 30 minutes. Greater than 50% of time was spent in  counseling and coordination of care.  Follow-Up Instructions: Return in about 6 months (around 08/07/2018) for MFPS, OA.   Bo Merino, MD  Note - This record has been created using Editor, commissioning.  Chart creation errors have been sought, but may not always  have been located. Such creation errors do not reflect on  the standard of medical care.

## 2018-02-05 DIAGNOSIS — K080 Exfoliation of teeth due to systemic causes: Secondary | ICD-10-CM | POA: Diagnosis not present

## 2018-02-06 ENCOUNTER — Telehealth: Payer: Self-pay

## 2018-02-06 ENCOUNTER — Ambulatory Visit: Payer: Federal, State, Local not specified - PPO | Admitting: Rheumatology

## 2018-02-06 ENCOUNTER — Encounter: Payer: Self-pay | Admitting: Rheumatology

## 2018-02-06 VITALS — BP 107/64 | HR 75 | Resp 12 | Ht 66.5 in | Wt 157.2 lb

## 2018-02-06 DIAGNOSIS — M7918 Myalgia, other site: Secondary | ICD-10-CM

## 2018-02-06 DIAGNOSIS — M19042 Primary osteoarthritis, left hand: Secondary | ICD-10-CM

## 2018-02-06 DIAGNOSIS — Z8669 Personal history of other diseases of the nervous system and sense organs: Secondary | ICD-10-CM

## 2018-02-06 DIAGNOSIS — F5101 Primary insomnia: Secondary | ICD-10-CM

## 2018-02-06 DIAGNOSIS — R5383 Other fatigue: Secondary | ICD-10-CM | POA: Diagnosis not present

## 2018-02-06 DIAGNOSIS — M19041 Primary osteoarthritis, right hand: Secondary | ICD-10-CM | POA: Diagnosis not present

## 2018-02-06 DIAGNOSIS — M19071 Primary osteoarthritis, right ankle and foot: Secondary | ICD-10-CM | POA: Diagnosis not present

## 2018-02-06 DIAGNOSIS — Z8659 Personal history of other mental and behavioral disorders: Secondary | ICD-10-CM

## 2018-02-06 DIAGNOSIS — Z5181 Encounter for therapeutic drug level monitoring: Secondary | ICD-10-CM | POA: Diagnosis not present

## 2018-02-06 DIAGNOSIS — M19072 Primary osteoarthritis, left ankle and foot: Secondary | ICD-10-CM

## 2018-02-06 NOTE — Telephone Encounter (Signed)
For next tramadol refill, please change to 1 tablet by mouth daily, as needed, Per Dr. Estanislado Pandy. Thanks!

## 2018-02-09 LAB — CBC WITH DIFFERENTIAL/PLATELET
Basophils Absolute: 49 cells/uL (ref 0–200)
Basophils Relative: 1.3 %
Eosinophils Absolute: 213 cells/uL (ref 15–500)
Eosinophils Relative: 5.6 %
HCT: 40.6 % (ref 35.0–45.0)
Hemoglobin: 13.7 g/dL (ref 11.7–15.5)
Lymphs Abs: 1360 cells/uL (ref 850–3900)
MCH: 30.5 pg (ref 27.0–33.0)
MCHC: 33.7 g/dL (ref 32.0–36.0)
MCV: 90.4 fL (ref 80.0–100.0)
MPV: 11 fL (ref 7.5–12.5)
Monocytes Relative: 11.1 %
NEUTROS PCT: 46.2 %
Neutro Abs: 1756 cells/uL (ref 1500–7800)
PLATELETS: 278 10*3/uL (ref 140–400)
RBC: 4.49 10*6/uL (ref 3.80–5.10)
RDW: 12 % (ref 11.0–15.0)
TOTAL LYMPHOCYTE: 35.8 %
WBC mixed population: 422 cells/uL (ref 200–950)
WBC: 3.8 10*3/uL (ref 3.8–10.8)

## 2018-02-09 LAB — PAIN MGMT, PROFILE 5 W/CONF, U
AMPHETAMINES: NEGATIVE ng/mL (ref <500)
BENZODIAZEPINES: NEGATIVE ng/mL (ref <100)
Barbiturates: NEGATIVE ng/mL (ref <300)
COCAINE METABOLITE: NEGATIVE ng/mL (ref <150)
Creatinine: 65 mg/dL
Marijuana Metabolite: NEGATIVE ng/mL (ref <20)
Methadone Metabolite: NEGATIVE ng/mL (ref <100)
OPIATES: NEGATIVE ng/mL (ref <100)
Oxidant: NEGATIVE ug/mL (ref <200)
Oxycodone: NEGATIVE ng/mL (ref <100)
pH: 6.77 (ref 4.5–9.0)

## 2018-02-09 LAB — COMPLETE METABOLIC PANEL WITH GFR
AG RATIO: 1.7 (calc) (ref 1.0–2.5)
ALKALINE PHOSPHATASE (APISO): 59 U/L (ref 33–130)
ALT: 18 U/L (ref 6–29)
AST: 20 U/L (ref 10–35)
Albumin: 4.3 g/dL (ref 3.6–5.1)
BILIRUBIN TOTAL: 0.4 mg/dL (ref 0.2–1.2)
BUN: 10 mg/dL (ref 7–25)
CHLORIDE: 104 mmol/L (ref 98–110)
CO2: 28 mmol/L (ref 20–32)
Calcium: 9.3 mg/dL (ref 8.6–10.4)
Creat: 0.68 mg/dL (ref 0.50–1.05)
GFR, Est African American: 117 mL/min/{1.73_m2} (ref >=60)
GFR, Est Non African American: 101 mL/min/{1.73_m2} (ref >=60)
Globulin: 2.6 g/dL (calc) (ref 1.9–3.7)
Glucose, Bld: 91 mg/dL (ref 65–99)
POTASSIUM: 4.1 mmol/L (ref 3.5–5.3)
Sodium: 139 mmol/L (ref 135–146)
TOTAL PROTEIN: 6.9 g/dL (ref 6.1–8.1)

## 2018-02-09 LAB — PAIN MGMT, TRAMADOL W/MEDMATCH, U
DESMETHYLTRAMADOL: 789 ng/mL — AB (ref <100)
Tramadol: 1425 ng/mL — ABNORMAL HIGH (ref <100)

## 2018-02-11 DIAGNOSIS — K080 Exfoliation of teeth due to systemic causes: Secondary | ICD-10-CM | POA: Diagnosis not present

## 2018-02-16 ENCOUNTER — Telehealth: Payer: Self-pay | Admitting: Rheumatology

## 2018-02-16 NOTE — Telephone Encounter (Signed)
Labs faxed to Dr. Lorie Apley office.

## 2018-02-16 NOTE — Telephone Encounter (Signed)
Dr. Lorie Apley office called requesting patient's labwork results be faxed to [phone] this afternoon.  Patient is scheduled for an appointment tomorrow morning.

## 2018-02-17 DIAGNOSIS — R194 Change in bowel habit: Secondary | ICD-10-CM | POA: Diagnosis not present

## 2018-02-17 DIAGNOSIS — Z1211 Encounter for screening for malignant neoplasm of colon: Secondary | ICD-10-CM | POA: Diagnosis not present

## 2018-02-17 DIAGNOSIS — Z800 Family history of malignant neoplasm of digestive organs: Secondary | ICD-10-CM | POA: Diagnosis not present

## 2018-03-02 DIAGNOSIS — Z800 Family history of malignant neoplasm of digestive organs: Secondary | ICD-10-CM | POA: Diagnosis not present

## 2018-03-02 DIAGNOSIS — Z1211 Encounter for screening for malignant neoplasm of colon: Secondary | ICD-10-CM | POA: Diagnosis not present

## 2018-03-09 DIAGNOSIS — K080 Exfoliation of teeth due to systemic causes: Secondary | ICD-10-CM | POA: Diagnosis not present

## 2018-03-18 DIAGNOSIS — F330 Major depressive disorder, recurrent, mild: Secondary | ICD-10-CM | POA: Diagnosis not present

## 2018-04-04 ENCOUNTER — Other Ambulatory Visit: Payer: Self-pay | Admitting: Physician Assistant

## 2018-04-06 NOTE — Telephone Encounter (Signed)
Last Visit: 02/06/18 Next visit: 08/12/18 UDS: 02/06/18 Narc Agreement: 02/06/18  Okay to refill tramadol?

## 2018-04-21 ENCOUNTER — Other Ambulatory Visit: Payer: Self-pay | Admitting: Rheumatology

## 2018-04-21 NOTE — Telephone Encounter (Signed)
Last Visit: 02/06/18 Next visit: 08/12/18 Labs: 02/06/18 CBC and CMP WNL.  Okay to refill per Dr. Estanislado Pandy

## 2018-04-27 DIAGNOSIS — F330 Major depressive disorder, recurrent, mild: Secondary | ICD-10-CM | POA: Diagnosis not present

## 2018-05-20 DIAGNOSIS — F330 Major depressive disorder, recurrent, mild: Secondary | ICD-10-CM | POA: Diagnosis not present

## 2018-05-22 DIAGNOSIS — F330 Major depressive disorder, recurrent, mild: Secondary | ICD-10-CM | POA: Diagnosis not present

## 2018-06-10 DIAGNOSIS — F3342 Major depressive disorder, recurrent, in full remission: Secondary | ICD-10-CM | POA: Diagnosis not present

## 2018-06-17 DIAGNOSIS — F3342 Major depressive disorder, recurrent, in full remission: Secondary | ICD-10-CM | POA: Diagnosis not present

## 2018-07-19 DIAGNOSIS — H16002 Unspecified corneal ulcer, left eye: Secondary | ICD-10-CM | POA: Diagnosis not present

## 2018-07-20 DIAGNOSIS — H16042 Marginal corneal ulcer, left eye: Secondary | ICD-10-CM | POA: Diagnosis not present

## 2018-07-22 DIAGNOSIS — F3342 Major depressive disorder, recurrent, in full remission: Secondary | ICD-10-CM | POA: Diagnosis not present

## 2018-07-24 DIAGNOSIS — H168 Other keratitis: Secondary | ICD-10-CM | POA: Diagnosis not present

## 2018-07-27 DIAGNOSIS — H16042 Marginal corneal ulcer, left eye: Secondary | ICD-10-CM | POA: Diagnosis not present

## 2018-07-31 DIAGNOSIS — L209 Atopic dermatitis, unspecified: Secondary | ICD-10-CM | POA: Diagnosis not present

## 2018-08-12 ENCOUNTER — Ambulatory Visit: Payer: Federal, State, Local not specified - PPO | Admitting: Rheumatology

## 2018-08-17 NOTE — Progress Notes (Signed)
Office Visit Note  Patient: Jacqueline Sloan             Date of Birth: [birth year]           MRN: [id number]             PCP: Leamon Arnt, MD Referring: Leamon Arnt, MD Visit Date: 08/24/2018 Occupation: @GUAROCC @  Subjective:  Arthritis (Bil hand, bil foot pain)   History of Present Illness: Jacqueline Sloan is a 53 y.o. female with history of osteoarthritis and myofascial pain syndrome.  She states she tried tramadol which did not help her symptoms and she discontinued it.  She also takes Mobic only on PRN basis now.  She continues to have some discomfort in her hands and feet.  She has been experiencing some knee joint popping.  She has discomfort coming down the stairs.  She denies any joint swelling.  Activities of Daily Living:  Patient reports morning stiffness for 1 hour.   Patient Denies nocturnal pain.  Difficulty dressing/grooming: Denies Difficulty climbing stairs: Reports Difficulty getting out of chair: Denies Difficulty using hands for taps, buttons, cutlery, and/or writing: Denies  Review of Systems  Constitutional: Negative for fatigue.  HENT: Negative for mouth dryness.   Eyes: Negative for dryness.  Respiratory: Negative for shortness of breath.   Cardiovascular: Negative for swelling in legs/feet.  Gastrointestinal: Negative for constipation.  Endocrine: Negative for increased urination.  Genitourinary: Negative for difficulty urinating.  Musculoskeletal: Positive for arthralgias, joint pain and morning stiffness.  Skin: Negative for rash.  Allergic/Immunologic: Negative for susceptible to infections.  Neurological: Negative for weakness.  Hematological: Negative for bruising/bleeding tendency.  Psychiatric/Behavioral: Negative for sleep disturbance.    PMFS History:  Patient Active Problem List   Diagnosis Date Noted  . Major depression, recurrent, chronic ([place]) 06/03/2017  . Myofascial pain syndrome 04/18/2016  . Primary osteoarthritis of  both hands 04/18/2016  . Primary osteoarthritis of both feet 04/18/2016  . Migraine without aura or status migrainosus 04/18/2016  . Primary insomnia 04/18/2016  . ALLERGIC RHINITIS 12/17/2006    Past Medical History:  Diagnosis Date  . ADD 05/18/2007  . ALLERGIC RHINITIS 12/17/2006  . ANXIETY 12/17/2006  . Depression   . GEN OSTEOARTHROSIS INVOLVING MULTIPLE SITES 12/29/2008  . Major depression, recurrent, chronic ([place]) 06/03/2017   Managed by Noemi Chapel, FNP psych  . Migraine   . Migraine without aura or status migrainosus 04/18/2016   Preventive with lamictal  . Myofascial pain syndrome 04/18/2016  . Nausea with vomiting 02/28/2008   Qualifier: Diagnosis of  By: Sherren Mocha MD, Jory Ee   . Other fatigue 04/18/2016  . Primary insomnia 04/18/2016  . Primary osteoarthritis of both feet 04/18/2016  . Primary osteoarthritis of both hands 04/18/2016  . Viral URI 02/11/2011    Family History  Problem Relation Age of Onset  . Hypertension Mother   . Congestive Heart Failure Mother   . Arthritis Mother   . Healthy Son   . Healthy Son   . Rheum arthritis Maternal Grandmother   . Arthritis Maternal Grandfather   . Colon cancer Maternal Grandfather 58  . Cancer Neg Hx        family hx endometrial ca   Past Surgical History:  Procedure Laterality Date  . CESAREAN SECTION  2002  . TONSILECTOMY, ADENOIDECTOMY, BILATERAL MYRINGOTOMY AND TUBES  2014   Social History   Social History Narrative  . Not on file   Immunization History  Administered Date(s) Administered  .  Td 04/09/1999, 05/03/2010     Objective: Vital Signs: BP (!) 100/48 (BP Location: Left Arm, Patient Position: Sitting, Cuff Size: Normal)   Pulse 75   Resp 14   Ht 5' 6.5" (1.689 m)   Wt 153 lb (69.4 kg)   LMP 08/17/2018   BMI 24.32 kg/m    Physical Exam Vitals signs and nursing note reviewed.  Constitutional:      Appearance: She is well-developed.  HENT:     Head: Normocephalic and atraumatic.  Eyes:      Conjunctiva/sclera: Conjunctivae normal.  Neck:     Musculoskeletal: Normal range of motion.  Cardiovascular:     Rate and Rhythm: Normal rate and regular rhythm.     Heart sounds: Normal heart sounds.  Pulmonary:     Effort: Pulmonary effort is normal.     Breath sounds: Normal breath sounds.  Abdominal:     General: Bowel sounds are normal.     Palpations: Abdomen is soft.  Lymphadenopathy:     Cervical: No cervical adenopathy.  Skin:    General: Skin is warm and dry.     Capillary Refill: Capillary refill takes less than 2 seconds.  Neurological:     Mental Status: She is alert and oriented to person, place, and time.  Psychiatric:        Behavior: Behavior normal.      Musculoskeletal Exam: C-spine thoracic and lumbar spine good range of motion.  Shoulder joints elbow joints wrist joint MCPs PIPs DIPs been good range of motion.  She has DIP and PIP thickening in her hands consistent with osteoarthritis.  Hip joints are in good range of motion.  She is crepitus in her bilateral knee joints without any warmth swelling or effusion.  She has DIP and PIP thickening in her feet consistent with osteoarthritis.  CDAI Exam: CDAI Score: Not documented Patient Global Assessment: Not documented; Provider Global Assessment: Not documented Swollen: Not documented; Tender: Not documented Joint Exam   Not documented   There is currently no information documented on the homunculus. Go to the Rheumatology activity and complete the homunculus joint exam.  Investigation: No additional findings.  Imaging: No results found.  Recent Labs: Lab Results  Component Value Date   WBC 3.8 02/06/2018   HGB 13.7 02/06/2018   PLT 278 02/06/2018   NA 139 02/06/2018   K 4.1 02/06/2018   CL 104 02/06/2018   CO2 28 02/06/2018   GLUCOSE 91 02/06/2018   BUN 10 02/06/2018   CREATININE 0.68 02/06/2018   BILITOT 0.4 02/06/2018   ALKPHOS 50 10/16/2016   AST 20 02/06/2018   ALT 18 02/06/2018    PROT 6.9 02/06/2018   ALBUMIN 4.2 10/16/2016   CALCIUM 9.3 02/06/2018   GFRAA 117 02/06/2018    Speciality Comments: No specialty comments available.  Procedures:  No procedures performed Allergies: Penicillins   Assessment / Plan:     Visit Diagnoses: Myofascial pain syndrome -patient takes Mobic on PRN basis.  She discontinued tramadol as she did not feel any benefit from that.  Need for regular exercise and stretching was discussed.   Primary osteoarthritis of both hands-she continues to have some discomfort in her hands.  No warmth swelling or effusion was noted.  Clinical findings are consistent with osteoarthritis.  Hand muscle strengthening exercises were demonstrated in the office and a handout on hand exercises was given.  Use of natural anti-inflammatories was discussed.  Chronic pain of both knees -she complains of discomfort in  her knee joints and crepitus in her knee joints.  Plan: XR KNEE 3 VIEW RIGHT, XR KNEE 3 VIEW LEFT.  The x-ray showed mild osteoarthritis and mild chondromalacia patella.  A handout on knee exercises was given.  Primary osteoarthritis of both feet-she has some discomfort in her feet.  Other fatigue-relates to myofascial pain.  Primary insomnia - Ambien 5 mg p.o.   History of anxiety  History of depression - she is on Cymbalta.  History of migraine   Orders: Orders Placed This Encounter  Procedures  . XR KNEE 3 VIEW RIGHT  . XR KNEE 3 VIEW LEFT   No orders of the defined types were placed in this encounter.    Follow-Up Instructions: Return in about 6 months (around 02/24/2019) for Myofascial pain, osteoarthritis.   Bo Merino, MD  Note - This record has been created using Editor, commissioning.  Chart creation errors have been sought, but may not always  have been located. Such creation errors do not reflect on  the standard of medical care.

## 2018-08-21 DIAGNOSIS — F3342 Major depressive disorder, recurrent, in full remission: Secondary | ICD-10-CM | POA: Diagnosis not present

## 2018-08-24 ENCOUNTER — Encounter: Payer: Self-pay | Admitting: Rheumatology

## 2018-08-24 ENCOUNTER — Ambulatory Visit: Payer: Self-pay

## 2018-08-24 ENCOUNTER — Ambulatory Visit (INDEPENDENT_AMBULATORY_CARE_PROVIDER_SITE_OTHER): Payer: Federal, State, Local not specified - PPO

## 2018-08-24 ENCOUNTER — Other Ambulatory Visit: Payer: Self-pay

## 2018-08-24 ENCOUNTER — Ambulatory Visit: Payer: Federal, State, Local not specified - PPO | Admitting: Rheumatology

## 2018-08-24 VITALS — BP 100/48 | HR 75 | Resp 14 | Ht 66.5 in | Wt 153.0 lb

## 2018-08-24 DIAGNOSIS — G8929 Other chronic pain: Secondary | ICD-10-CM | POA: Diagnosis not present

## 2018-08-24 DIAGNOSIS — M19072 Primary osteoarthritis, left ankle and foot: Secondary | ICD-10-CM

## 2018-08-24 DIAGNOSIS — M19041 Primary osteoarthritis, right hand: Secondary | ICD-10-CM | POA: Diagnosis not present

## 2018-08-24 DIAGNOSIS — M19071 Primary osteoarthritis, right ankle and foot: Secondary | ICD-10-CM

## 2018-08-24 DIAGNOSIS — Z8669 Personal history of other diseases of the nervous system and sense organs: Secondary | ICD-10-CM

## 2018-08-24 DIAGNOSIS — M25562 Pain in left knee: Secondary | ICD-10-CM

## 2018-08-24 DIAGNOSIS — Z8659 Personal history of other mental and behavioral disorders: Secondary | ICD-10-CM

## 2018-08-24 DIAGNOSIS — R5383 Other fatigue: Secondary | ICD-10-CM

## 2018-08-24 DIAGNOSIS — M7918 Myalgia, other site: Secondary | ICD-10-CM | POA: Diagnosis not present

## 2018-08-24 DIAGNOSIS — M25561 Pain in right knee: Secondary | ICD-10-CM

## 2018-08-24 DIAGNOSIS — M19042 Primary osteoarthritis, left hand: Secondary | ICD-10-CM

## 2018-08-24 DIAGNOSIS — F5101 Primary insomnia: Secondary | ICD-10-CM

## 2018-08-24 NOTE — Patient Instructions (Signed)
Hand Exercises Hand exercises can be helpful to almost anyone. These exercises can strengthen the hands, improve flexibility and movement, and increase blood flow to the hands. These results can make work and daily tasks easier. Hand exercises can be especially helpful for people who have joint pain from arthritis or have nerve damage from overuse (carpal tunnel syndrome). These exercises can also help people who have injured a hand. Most of these hand exercises are fairly gentle stretching routines. You can do them often throughout the day. Still, it is a good idea to ask your health care provider which exercises would be best for you. Warming your hands before exercise may help to reduce stiffness. You can do this with gentle massage or by placing your hands in warm water for 15 minutes. Also, make sure you pay attention to your level of hand pain as you begin an exercise routine. Exercises Knuckle bend Repeat this exercise 5-10 times with each hand. 1. Stand or sit with your arm, hand, and all five fingers pointed straight up. Make sure your wrist is straight. 2. Gently and slowly bend your fingers down and inward until the tips of your fingers are touching the tops of your palm. 3. Hold this position for a few seconds. 4. Extend your fingers out to their original position, all pointing straight up again. Finger fan Repeat this exercise 5-10 times with each hand. 1. Hold your arm and hand out in front of you. Keep your wrist straight. 2. Squeeze your hand into a fist. 3. Hold this position for a few seconds. 4. Fan out, or spread apart, your hand and fingers as much as possible, stretching every joint fully. Tabletop Repeat this exercise 5-10 times with each hand. 1. Stand or sit with your arm, hand, and all five fingers pointed straight up. Make sure your wrist is straight. 2. Gently and slowly bend your fingers at the knuckles where they meet the hand until your hand is making an upside-down  L shape. Your fingers should form a tabletop. 3. Hold this position for a few seconds. 4. Extend your fingers out to their original position, all pointing straight up again. Making Os Repeat this exercise 5-10 times with each hand. 1. Stand or sit with your arm, hand, and all five fingers pointed straight up. Make sure your wrist is straight. 2. Make an O shape by touching your pointer finger to your thumb. Hold for a few seconds. Then open your hand wide. 3. Repeat this motion with each finger on your hand. Table spread Repeat this exercise 5-10 times with each hand. 1. Place your hand on a table with your palm facing down. Make sure your wrist is straight. 2. Spread your fingers out as much as possible. Hold this position for a few seconds. 3. Slide your fingers back together again. Hold for a few seconds. Ball grip Repeat this exercise 10-15 times with each hand. 1. Hold a tennis ball or another soft ball in your hand. 2. While slowly increasing pressure, squeeze the ball as hard as possible. 3. Squeeze as hard as you can for 3-5 seconds. 4. Relax and repeat.  Wrist curls Repeat this exercise 10-15 times with each hand. 1. Sit in a chair that has armrests. 2. Hold a light weight in your hand, such as a dumbbell that weighs 1-3 pounds (0.5-1.4 kg). Ask your health care provider what weight would be best for you. 3. Rest your hand just over the end of the chair arm   with your palm facing up. 4. Gently pivot your wrist up and down while holding the weight. Do not twist your wrist from side to side. Contact a health care provider if:  Your hand pain or discomfort gets much worse when you do an exercise.  Your hand pain or discomfort does not improve within 2 hours after you exercise. If you have any of these problems, stop doing these exercises right away. Do not do them again unless your health care provider says that you can. Get help right away if:  You develop sudden, severe hand  pain. If this happens, stop doing these exercises right away. Do not do them again unless your health care provider says that you can. This information is not intended to replace advice given to you by your health care provider. Make sure you discuss any questions you have with your health care provider. Document Released: 03/06/2015 Document Revised: 07/29/2017 Document Reviewed: 10/03/2014 Elsevier Interactive Patient Education  2019 [employer] Knee Exercises Ask your health care provider which exercises are safe for you. Do exercises exactly as told by your health care provider and adjust them as directed. It is normal to feel mild stretching, pulling, tightness, or discomfort as you do these exercises, but you should stop right away if you feel sudden pain or your pain gets worse.Do not begin these exercises until told by your health care provider. STRETCHING AND RANGE OF MOTION EXERCISES These exercises warm up your muscles and joints and improve the movement and flexibility of your knee. These exercises also help to relieve pain, numbness, and tingling. Exercise A: Knee Extension, Prone 1. Lie on your abdomen on a bed. 2. Place your left / right knee just beyond the edge of the surface so your knee is not on the bed. You can put a towel under your left / right thigh just above your knee for comfort. 3. Relax your leg muscles and allow gravity to straighten your knee. You should feel a stretch behind your left / right knee. 4. Hold this position for __________ seconds. 5. Scoot up so your knee is supported between repetitions. Repeat __________ times. Complete this stretch __________ times a day. Exercise B: Knee Flexion, Active  1. Lie on your back with both knees straight. If this causes back discomfort, bend your left / right knee so your foot is flat on the floor. 2. Slowly slide your left / right heel back toward your buttocks until you feel a gentle stretch in the front of your knee  or thigh. 3. Hold this position for __________ seconds. 4. Slowly slide your left / right heel back to the starting position. Repeat __________ times. Complete this exercise __________ times a day. Exercise C: Quadriceps, Prone  1. Lie on your abdomen on a firm surface, such as a bed or padded floor. 2. Bend your left / right knee and hold your ankle. If you cannot reach your ankle or pant leg, loop a belt around your foot and grab the belt instead. 3. Gently pull your heel toward your buttocks. Your knee should not slide out to the side. You should feel a stretch in the front of your thigh and knee. 4. Hold this position for __________ seconds. Repeat __________ times. Complete this stretch __________ times a day. Exercise D: Hamstring, Supine 1. Lie on your back. 2. Loop a belt or towel over the ball of your left / right foot. The ball of your foot is on the walking surface,   right under your toes. 3. Straighten your left / right knee and slowly pull on the belt to raise your leg until you feel a gentle stretch behind your knee. ? Do not let your left / right knee bend while you do this. ? Keep your other leg flat on the floor. 4. Hold this position for __________ seconds. Repeat __________ times. Complete this stretch __________ times a day. STRENGTHENING EXERCISES These exercises build strength and endurance in your knee. Endurance is the ability to use your muscles for a long time, even after they get tired. Exercise E: Quadriceps, Isometric  1. Lie on your back with your left / right leg extended and your other knee bent. Put a rolled towel or small pillow under your knee if told by your health care provider. 2. Slowly tense the muscles in the front of your left / right thigh. You should see your kneecap slide up toward your hip or see increased dimpling just above the knee. This motion will push the back of the knee toward the floor. 3. For __________ seconds, keep the muscle as tight  as you can without increasing your pain. 4. Relax the muscles slowly and completely. Repeat __________ times. Complete this exercise __________ times a day. Exercise F: Straight Leg Raises - Quadriceps 1. Lie on your back with your left / right leg extended and your other knee bent. 2. Tense the muscles in the front of your left / right thigh. You should see your kneecap slide up or see increased dimpling just above the knee. Your thigh may even shake a bit. 3. Keep these muscles tight as you raise your leg 4-6 inches (10-15 cm) off the floor. Do not let your knee bend. 4. Hold this position for __________ seconds. 5. Keep these muscles tense as you lower your leg. 6. Relax your muscles slowly and completely after each repetition. Repeat __________ times. Complete this exercise __________ times a day. Exercise G: Hamstring, Isometric 1. Lie on your back on a firm surface. 2. Bend your left / right knee approximately __________ degrees. 3. Dig your left / right heel into the surface as if you are trying to pull it toward your buttocks. Tighten the muscles in the back of your thighs to dig as hard as you can without increasing any pain. 4. Hold this position for __________ seconds. 5. Release the tension gradually and allow your muscles to relax completely for __________ seconds after each repetition. Repeat __________ times. Complete this exercise __________ times a day. Exercise H: Hamstring Curls  If told by your health care provider, do this exercise while wearing ankle weights. Begin with __________ weights. Then increase the weight by 1 lb (0.5 kg) increments. Do not wear ankle weights that are more than __________. 1. Lie on your abdomen with your legs straight. 2. Bend your left / right knee as far as you can without feeling pain. Keep your hips flat against the floor. 3. Hold this position for __________ seconds. 4. Slowly lower your leg to the starting position.  Repeat __________  times. Complete this exercise __________ times a day. Exercise I: Squats (Quadriceps) 1. Stand in front of a table, with your feet and knees pointing straight ahead. You may rest your hands on the table for balance but not for support. 2. Slowly bend your knees and lower your hips like you are going to sit in a chair. ? Keep your weight over your heels, not over your toes. ? Keep your   lower legs upright so they are parallel with the table legs. ? Do not let your hips go lower than your knees. ? Do not bend lower than told by your health care provider. ? If your knee pain increases, do not bend as low. 3. Hold the squat position for __________ seconds. 4. Slowly push with your legs to return to standing. Do not use your hands to pull yourself to standing. Repeat __________ times. Complete this exercise __________ times a day. Exercise J: Wall Slides (Quadriceps)  1. Lean your back against a smooth wall or door while you walk your feet out 18-24 inches (46-61 cm) from it. 2. Place your feet hip-width apart. 3. Slowly slide down the wall or door until your knees bend __________ degrees. Keep your knees over your heels, not over your toes. Keep your knees in line with your hips. 4. Hold for __________ seconds. Repeat __________ times. Complete this exercise __________ times a day. Exercise K: Straight Leg Raises - Hip Abductors 1. Lie on your side with your left / right leg in the top position. Lie so your head, shoulder, knee, and hip line up. You may bend your bottom knee to help you keep your balance. 2. Roll your hips slightly forward so your hips are stacked directly over each other and your left / right knee is facing forward. 3. Leading with your heel, lift your top leg 4-6 inches (10-15 cm). You should feel the muscles in your outer hip lifting. ? Do not let your foot drift forward. ? Do not let your knee roll toward the ceiling. 4. Hold this position for __________ seconds. 5. Slowly  return your leg to the starting position. 6. Let your muscles relax completely after each repetition. Repeat __________ times. Complete this exercise __________ times a day. Exercise L: Straight Leg Raises - Hip Extensors 1. Lie on your abdomen on a firm surface. You can put a pillow under your hips if that is more comfortable. 2. Tense the muscles in your buttocks and lift your left / right leg about 4-6 inches (10-15 cm). Keep your knee straight as you lift your leg. 3. Hold this position for __________ seconds. 4. Slowly lower your leg to the starting position. 5. Let your leg relax completely after each repetition. Repeat __________ times. Complete this exercise __________ times a day. This information is not intended to replace advice given to you by your health care provider. Make sure you discuss any questions you have with your health care provider. Document Released: 02/06/2005 Document Revised: 12/18/2015 Document Reviewed: 01/29/2015 Elsevier Interactive Patient Education  2018 [employer]  

## 2018-09-03 DIAGNOSIS — Z03818 Encounter for observation for suspected exposure to other biological agents ruled out: Secondary | ICD-10-CM | POA: Diagnosis not present

## 2018-10-22 DIAGNOSIS — Z1231 Encounter for screening mammogram for malignant neoplasm of breast: Secondary | ICD-10-CM | POA: Diagnosis not present

## 2018-12-08 DIAGNOSIS — F331 Major depressive disorder, recurrent, moderate: Secondary | ICD-10-CM | POA: Diagnosis not present

## 2018-12-11 ENCOUNTER — Other Ambulatory Visit: Payer: Self-pay

## 2018-12-11 DIAGNOSIS — Z20822 Contact with and (suspected) exposure to covid-19: Secondary | ICD-10-CM

## 2018-12-11 DIAGNOSIS — R6889 Other general symptoms and signs: Secondary | ICD-10-CM | POA: Diagnosis not present

## 2018-12-12 LAB — NOVEL CORONAVIRUS, NAA: SARS-CoV-2, NAA: NOT DETECTED

## 2019-02-10 NOTE — Progress Notes (Signed)
Office Visit Note  Patient: Jacqueline Sloan             Date of Birth: 1965/07/14           MRN: [id number]             PCP: Leamon Arnt, MD Referring: Leamon Arnt, MD Visit Date: 02/24/2019 Occupation: @GUAROCC @  Subjective:  Pain in both feet.   History of Present Illness: Jacqueline Sloan is a 53 y.o. female with history of myofascial pain and osteoarthritis.  She states recently she has been having increased pain and discomfort in her bilateral feet.  This happens every 2 to 3 days.  She continues to have some discomfort in her hands and knee joints which has not changed.  Activities of Daily Living:  Patient reports morning stiffness for 1 hour.   Patient Denies nocturnal pain.  Difficulty dressing/grooming: Denies Difficulty climbing stairs: Denies Difficulty getting out of chair: Denies Difficulty using hands for taps, buttons, cutlery, and/or writing: Reports  Review of Systems  Constitutional: Negative for fatigue.  HENT: Negative for mouth sores, mouth dryness and nose dryness.   Eyes: Negative for itching and dryness.  Respiratory: Negative for shortness of breath, wheezing and difficulty breathing.   Cardiovascular: Negative for chest pain and palpitations.  Gastrointestinal: Negative for blood in stool, constipation and diarrhea.  Endocrine: Negative for increased urination.  Genitourinary: Negative for difficulty urinating and painful urination.  Musculoskeletal: Positive for arthralgias, joint pain, joint swelling and morning stiffness.  Skin: Negative for rash.  Allergic/Immunologic: Negative for susceptible to infections.  Neurological: Negative for dizziness, light-headedness, numbness, headaches, memory loss and weakness.  Hematological: Negative for bruising/bleeding tendency.  Psychiatric/Behavioral: Negative for confusion.    PMFS History:  Patient Active Problem List   Diagnosis Date Noted  . Major depression, recurrent, chronic ([place])  06/03/2017  . Myofascial pain syndrome 04/18/2016  . Primary osteoarthritis of both hands 04/18/2016  . Primary osteoarthritis of both feet 04/18/2016  . Migraine without aura or status migrainosus 04/18/2016  . Primary insomnia 04/18/2016  . ALLERGIC RHINITIS 12/17/2006    Past Medical History:  Diagnosis Date  . ADD 05/18/2007  . ALLERGIC RHINITIS 12/17/2006  . ANXIETY 12/17/2006  . Depression   . GEN OSTEOARTHROSIS INVOLVING MULTIPLE SITES 12/29/2008  . Major depression, recurrent, chronic ([place]) 06/03/2017   Managed by Noemi Chapel, FNP psych  . Migraine   . Migraine without aura or status migrainosus 04/18/2016   Preventive with lamictal  . Myofascial pain syndrome 04/18/2016  . Nausea with vomiting 02/28/2008   Qualifier: Diagnosis of  By: Sherren Mocha MD, Jory Ee   . Other fatigue 04/18/2016  . Primary insomnia 04/18/2016  . Primary osteoarthritis of both feet 04/18/2016  . Primary osteoarthritis of both hands 04/18/2016  . Viral URI 02/11/2011    Family History  Problem Relation Age of Onset  . Hypertension Mother   . Congestive Heart Failure Mother   . Arthritis Mother   . Healthy Son   . Healthy Son   . Rheum arthritis Maternal Grandmother   . Arthritis Maternal Grandfather   . Colon cancer Maternal Grandfather 81  . Cancer Neg Hx        family hx endometrial ca   Past Surgical History:  Procedure Laterality Date  . CESAREAN SECTION  2002  . TONSILECTOMY, ADENOIDECTOMY, BILATERAL MYRINGOTOMY AND TUBES  2014   Social History   Social History Narrative  . Not on file   Immunization  History  Administered Date(s) Administered  . Td 04/09/1999, 05/03/2010     Objective: Vital Signs: BP 109/72 (BP Location: Left Arm, Patient Position: Sitting, Cuff Size: Normal)   Pulse 70   Resp 12   Ht 5\' 6"  (1.676 m)   Wt 154 lb (69.9 kg)   BMI 24.86 kg/m    Physical Exam Vitals signs and nursing note reviewed.  Constitutional:      Appearance: She is well-developed.  HENT:      Head: Normocephalic and atraumatic.  Eyes:     Conjunctiva/sclera: Conjunctivae normal.  Neck:     Musculoskeletal: Normal range of motion.  Cardiovascular:     Rate and Rhythm: Normal rate and regular rhythm.     Heart sounds: Normal heart sounds.  Pulmonary:     Effort: Pulmonary effort is normal.     Breath sounds: Normal breath sounds.  Abdominal:     General: Bowel sounds are normal.     Palpations: Abdomen is soft.  Lymphadenopathy:     Cervical: No cervical adenopathy.  Skin:    General: Skin is warm and dry.     Capillary Refill: Capillary refill takes less than 2 seconds.  Neurological:     Mental Status: She is alert and oriented to person, place, and time.  Psychiatric:        Behavior: Behavior normal.      Musculoskeletal Exam: C-spine thoracic and lumbar spine were in good range of motion.  Shoulder joints elbow joints wrist joints were in good range of motion.  She has some PIP and DIP thickening in her hands without any synovitis.  Hip joints and knee joints with good range of motion.  She had no tenderness across MTPs or PIPs.  She has some calluses under the ball of her feet.  CDAI Exam: CDAI Score: - Patient Global: -; Provider Global: - Swollen: -; Tender: - Joint Exam   No joint exam has been documented for this visit   There is currently no information documented on the homunculus. Go to the Rheumatology activity and complete the homunculus joint exam.  Investigation: No additional findings.  Imaging: Xr Foot 2 Views Left  Result Date: 02/24/2019 Minimal first MTP, PIP and DIP narrowing was noted.  No intertarsal or tibiotalar joint space narrowing was noted.  No erosive changes were noted. Impression: These findings are consistent with mild osteoarthritis of the foot.  Xr Foot 2 Views Right  Result Date: 02/24/2019 Minimal first MTP, PIP and DIP narrowing was noted.  No intertarsal or tibiotalar joint space narrowing was noted.  No erosive  changes were noted. Impression: These findings are consistent with mild osteoarthritis of the foot.   Recent Labs: Lab Results  Component Value Date   WBC 3.8 02/06/2018   HGB 13.7 02/06/2018   PLT 278 02/06/2018   NA 139 02/06/2018   K 4.1 02/06/2018   CL 104 02/06/2018   CO2 28 02/06/2018   GLUCOSE 91 02/06/2018   BUN 10 02/06/2018   CREATININE 0.68 02/06/2018   BILITOT 0.4 02/06/2018   ALKPHOS 50 10/16/2016   AST 20 02/06/2018   ALT 18 02/06/2018   PROT 6.9 02/06/2018   ALBUMIN 4.2 10/16/2016   CALCIUM 9.3 02/06/2018   GFRAA 117 02/06/2018    Speciality Comments: No specialty comments available.  Procedures:  No procedures performed Allergies: Penicillins   Assessment / Plan:     Visit Diagnoses: Myofascial pain syndrome-she continues to have some generalized pain and discomfort.  Primary osteoarthritis of both hands-she has stiffness in her hands and mild arthritis.  Joint protection was discussed.  Primary osteoarthritis of both knees - XR on 08/24/18-mild OA  Pain in both feet - Plan: XR Foot 2 Views Right, XR Foot 2 Views Left.  The x-rays were consistent with mild osteoarthritic changes.  She has been having increased  discomfort in her bilateral feet.  Proper fitting shoes with insoles were discussed.  Patient states she has significant stiffness in the morning.  I offered labs but she declined.  Primary osteoarthritis of both feet  Other fatigue-secondary to insomnia.  Primary insomnia-good sleep hygiene discussed.  History of anxiety  History of depression  History of migraine  Orders: Orders Placed This Encounter  Procedures  . XR Foot 2 Views Right  . XR Foot 2 Views Left   No orders of the defined types were placed in this encounter.    Follow-Up Instructions: Return in about 6 months (around 08/24/2019) for [place].   Bo Merino, MD  Note - This record has been created using Editor, commissioning.  Chart creation errors  have been sought, but may not always  have been located. Such creation errors do not reflect on  the standard of medical care.

## 2019-02-12 DIAGNOSIS — F331 Major depressive disorder, recurrent, moderate: Secondary | ICD-10-CM | POA: Diagnosis not present

## 2019-02-24 ENCOUNTER — Ambulatory Visit: Payer: Federal, State, Local not specified - PPO | Admitting: Rheumatology

## 2019-02-24 ENCOUNTER — Other Ambulatory Visit: Payer: Self-pay

## 2019-02-24 ENCOUNTER — Encounter: Payer: Self-pay | Admitting: Rheumatology

## 2019-02-24 ENCOUNTER — Ambulatory Visit: Payer: Self-pay

## 2019-02-24 VITALS — BP 109/72 | HR 70 | Resp 12 | Ht 66.0 in | Wt 154.0 lb

## 2019-02-24 DIAGNOSIS — M170 Bilateral primary osteoarthritis of knee: Secondary | ICD-10-CM

## 2019-02-24 DIAGNOSIS — M7918 Myalgia, other site: Secondary | ICD-10-CM

## 2019-02-24 DIAGNOSIS — F5101 Primary insomnia: Secondary | ICD-10-CM

## 2019-02-24 DIAGNOSIS — Z8659 Personal history of other mental and behavioral disorders: Secondary | ICD-10-CM

## 2019-02-24 DIAGNOSIS — M19072 Primary osteoarthritis, left ankle and foot: Secondary | ICD-10-CM

## 2019-02-24 DIAGNOSIS — M79671 Pain in right foot: Secondary | ICD-10-CM | POA: Diagnosis not present

## 2019-02-24 DIAGNOSIS — R5383 Other fatigue: Secondary | ICD-10-CM

## 2019-02-24 DIAGNOSIS — M19041 Primary osteoarthritis, right hand: Secondary | ICD-10-CM

## 2019-02-24 DIAGNOSIS — M79672 Pain in left foot: Secondary | ICD-10-CM | POA: Diagnosis not present

## 2019-02-24 DIAGNOSIS — M19071 Primary osteoarthritis, right ankle and foot: Secondary | ICD-10-CM

## 2019-02-24 DIAGNOSIS — M19042 Primary osteoarthritis, left hand: Secondary | ICD-10-CM

## 2019-02-24 DIAGNOSIS — Z8669 Personal history of other diseases of the nervous system and sense organs: Secondary | ICD-10-CM

## 2019-03-03 DIAGNOSIS — Z9189 Other specified personal risk factors, not elsewhere classified: Secondary | ICD-10-CM | POA: Diagnosis not present

## 2019-03-03 DIAGNOSIS — R509 Fever, unspecified: Secondary | ICD-10-CM | POA: Diagnosis not present

## 2019-03-03 DIAGNOSIS — U071 COVID-19: Secondary | ICD-10-CM | POA: Diagnosis not present

## 2019-03-08 DIAGNOSIS — U071 COVID-19: Secondary | ICD-10-CM | POA: Diagnosis not present

## 2019-03-08 DIAGNOSIS — Z9189 Other specified personal risk factors, not elsewhere classified: Secondary | ICD-10-CM | POA: Diagnosis not present

## 2019-03-08 DIAGNOSIS — F3342 Major depressive disorder, recurrent, in full remission: Secondary | ICD-10-CM | POA: Diagnosis not present

## 2019-03-16 DIAGNOSIS — R0602 Shortness of breath: Secondary | ICD-10-CM | POA: Diagnosis not present

## 2019-03-16 DIAGNOSIS — U071 COVID-19: Secondary | ICD-10-CM | POA: Diagnosis not present

## 2019-03-16 DIAGNOSIS — Z1159 Encounter for screening for other viral diseases: Secondary | ICD-10-CM | POA: Diagnosis not present

## 2019-03-17 DIAGNOSIS — R9389 Abnormal findings on diagnostic imaging of other specified body structures: Secondary | ICD-10-CM | POA: Diagnosis not present

## 2019-03-17 DIAGNOSIS — R942 Abnormal results of pulmonary function studies: Secondary | ICD-10-CM | POA: Diagnosis not present

## 2019-03-17 DIAGNOSIS — R0602 Shortness of breath: Secondary | ICD-10-CM | POA: Diagnosis not present

## 2019-03-18 DIAGNOSIS — F3342 Major depressive disorder, recurrent, in full remission: Secondary | ICD-10-CM | POA: Diagnosis not present

## 2019-03-23 DIAGNOSIS — H40033 Anatomical narrow angle, bilateral: Secondary | ICD-10-CM | POA: Diagnosis not present

## 2019-04-07 DIAGNOSIS — U071 COVID-19: Secondary | ICD-10-CM | POA: Diagnosis not present

## 2019-04-07 DIAGNOSIS — R942 Abnormal results of pulmonary function studies: Secondary | ICD-10-CM | POA: Diagnosis not present

## 2019-07-30 ENCOUNTER — Telehealth: Payer: Self-pay | Admitting: Family Medicine

## 2019-07-30 ENCOUNTER — Telehealth: Payer: Federal, State, Local not specified - PPO | Admitting: Family Medicine

## 2019-07-30 ENCOUNTER — Encounter: Payer: Self-pay | Admitting: Family Medicine

## 2019-07-30 ENCOUNTER — Telehealth (INDEPENDENT_AMBULATORY_CARE_PROVIDER_SITE_OTHER): Payer: Federal, State, Local not specified - PPO | Admitting: Family Medicine

## 2019-07-30 DIAGNOSIS — W57XXXA Bitten or stung by nonvenomous insect and other nonvenomous arthropods, initial encounter: Secondary | ICD-10-CM | POA: Diagnosis not present

## 2019-07-30 DIAGNOSIS — Z8619 Personal history of other infectious and parasitic diseases: Secondary | ICD-10-CM

## 2019-07-30 DIAGNOSIS — S30861A Insect bite (nonvenomous) of abdominal wall, initial encounter: Secondary | ICD-10-CM

## 2019-07-30 MED ORDER — DOXYCYCLINE HYCLATE 100 MG PO TABS: 100.0000 mg | ORAL_TABLET | Freq: Two times a day (BID) | ORAL | 0 refills | Status: AC

## 2019-07-30 NOTE — Telephone Encounter (Signed)
Patient spoke with [facility] today 07/30/19 at 11:18am I have scheduled patient for a mychart visit with Dr. Volanda Napoleon today.  Patient Name: Jacqueline Sloan Gender: Female DOB: 08-28-1965 Age: 54 Y 10 M 12 D Return Phone Number: [phone] (Primary) Address: City/State/Zip: [place] [address] Client [facility] - Cli Client Site [place] Physician Billey Chang- MD Contact Type Call Who Is Calling Patient / Member / Family / Caregiver Call Type Triage / Clinical Relationship To Patient Self Return Phone Number [phone] (Primary) Chief Complaint Tick Bite Reason for Call Symptomatic / Request for [place] states she believes she may have lime disease. She has a headache, body ache and is congested. She had a tick 2 days ago. The location where is was at is red and swollen. Translation No Nurse Assessment Nurse: Zorita Pang, RN, Deborah Date/Time (Eastern Time): 07/30/2019 11:18:19 AM Confirm and document reason for call. If symptomatic, describe symptoms. ---The caller states that she thinks that she had lyme disease in 2000. She states that she had a tick bite on the left side underneath her bra under axilla. Tick was removed and head was removed. Has redness in that area. Has nasal congestion. Has the patient had close contact with a person known or suspected to have the novel coronavirus illness OR traveled / lives in area with major community spread (including international travel) in the last 14 days from the onset of symptoms? * If Asymptomatic, screen for exposure and travel within the last 14 days. ---No Does the patient have any new or worsening symptoms? ---Yes Will a triage be completed? ---Yes Related visit to physician within the last 2 weeks? ---No Does the PT have any chronic conditions? (i.e. diabetes, asthma, this includes High risk factors for pregnancy,  etc.) ---Yes List chronic conditions. ---osteo-arthritis Is the patient pregnant or possibly pregnant? (Ask all females between the ages of 6-55) ---No Is this a behavioral health or substance abuse call? ---No  Guidelines Guideline Title Affirmed Question Affirmed Notes Nurse Date/Time (Eastern Time) Tick Bite [1] 2 to 14 days following tick bite AND [2] widespread rash or headache AND [3] no fever Womble, RN, Neoma Laming 07/30/2019 11:21:29 AM Disp. Time Eilene Ghazi Time) Disposition Final User 07/30/2019 11:24:19 AM See PCP within 24 Hours Yes Zorita Pang, RN, Garrel Ridgel Disagree/Comply Comply Caller Understands Yes PreDisposition Call Doctor Care Advice Given Per Guideline SEE PCP WITHIN 24 HOURS: * You become worse. * Fever occurs Referrals REFERRED TO PCP OFFICE

## 2019-07-30 NOTE — Telephone Encounter (Signed)
FYI

## 2019-07-30 NOTE — Progress Notes (Signed)
Virtual Visit via Video Note  I connected with Jacqueline Sloan on 07/30/19 at  2:30 PM EDT by a video enabled telemedicine application 2/2 [id number] pandemic and verified that I am speaking with the correct person using two identifiers.  Location patient: home Location provider:work or home office Persons participating in the virtual visit: patient, provider  I discussed the limitations of evaluation and management by telemedicine and the availability of in person appointments. The patient expressed understanding and agreed to proceed.   HPI: Pt is a 54 yo female with pmh sig for migraines, allergies, OA, myofascial pain syndrome, insomnia, depression followed by Dr. Jonni Sanger.  Pt seen for acute concern.  Noticed a tick on her L side near bra line on Wed. Pt was spreading mulch a few days prior.  Pt had her husband pull off the tick with heated tweezers.  Has since developed a red lesion at site of tape, congestion, dull HA x 2 days similar to when she had COVID, pain in medial L great toe, and a frequent rash on face.  Pt endorses having lyme dz in 2000 at which time she had congestion.  Pt also notes having COVID-19 virus infection in November 2020.  Pt inquires if she can get COVID again as her HA is similar to when her symptoms started.  Also inquires if she should get vaccinated as she was told by a health care professional she did not need it.  ROS: See pertinent positives and negatives per HPI.  Past Medical History:  Diagnosis Date  . ADD 05/18/2007  . ALLERGIC RHINITIS 12/17/2006  . ANXIETY 12/17/2006  . Depression   . GEN OSTEOARTHROSIS INVOLVING MULTIPLE SITES 12/29/2008  . Major depression, recurrent, chronic ([place]) 06/03/2017   Managed by Noemi Chapel, FNP psych  . Migraine   . Migraine without aura or status migrainosus 04/18/2016   Preventive with lamictal  . Myofascial pain syndrome 04/18/2016  . Nausea with vomiting 02/28/2008   Qualifier: Diagnosis of  By: Sherren Mocha MD, Jory Ee   .  Other fatigue 04/18/2016  . Primary insomnia 04/18/2016  . Primary osteoarthritis of both feet 04/18/2016  . Primary osteoarthritis of both hands 04/18/2016  . Viral URI 02/11/2011    Past Surgical History:  Procedure Laterality Date  . CESAREAN SECTION  2002  . TONSILECTOMY, ADENOIDECTOMY, BILATERAL MYRINGOTOMY AND TUBES  2014    Family History  Problem Relation Age of Onset  . Hypertension Mother   . Congestive Heart Failure Mother   . Arthritis Mother   . Healthy Son   . Healthy Son   . Rheum arthritis Maternal Grandmother   . Arthritis Maternal Grandfather   . Colon cancer Maternal Grandfather 58  . Cancer Neg Hx        family hx endometrial ca     Current Outpatient Medications:  .  ALPRAZolam (XANAX) 0.25 MG tablet, Take 0.25 mg by mouth at bedtime as needed for anxiety., Disp: , Rfl:  .  cycloSPORINE (RESTASIS) 0.05 % ophthalmic emulsion, as needed., Disp: , Rfl:  .  escitalopram (LEXAPRO) 20 MG tablet, , Disp: , Rfl:  .  lamoTRIgine (LAMICTAL) 150 MG tablet, Take 150 mg by mouth daily., Disp: , Rfl:  .  meloxicam (MOBIC) 15 MG tablet, TAKE 1 TABLET BY MOUTH EVERY DAY (Patient not taking: Reported on 02/24/2019), Disp: 90 tablet, Rfl: 0 .  rizatriptan (MAXALT-MLT) 10 MG disintegrating tablet, as needed. , Disp: , Rfl:  .  tobramycin-dexamethasone (TOBRADEX) ophthalmic solution, INSTILL 1  DROP INTO LEFT EYE 4 TIMES A DAY AS DIRECTED, Disp: , Rfl: 0 .  zolpidem (AMBIEN) 5 MG tablet, as needed. , Disp: , Rfl:   EXAM:  VITALS per patient if applicable: RR between 123456 bpm  GENERAL: alert, oriented, appears well and in no acute distress  HEENT: atraumatic, conjunctiva clear, no obvious abnormalities on inspection of external nose and ears  NECK: normal movements of the head and neck  LUNGS: on inspection no signs of respiratory distress, breathing rate appears normal, no obvious gross SOB, gasping or wheezing  SKIN: 3.5 cm oval erythematous lesion on left lateral upper  abdomen beneath left breast.  Central area darker erythema adjacent to a hypopigmented freckle or mole. no target lesions noted     CV: no obvious cyanosis  MS: moves all visible extremities without noticeable abnormality  PSYCH/NEURO: pleasant and cooperative, no obvious depression or anxiety, speech and thought processing grossly intact  ASSESSMENT AND PLAN:  Discussed the following assessment and plan:  Tick bite of abdomen, initial encounter  -We will start doxycycline x14 days -Given precautions - Plan: doxycycline (VIBRA-TABS) 100 MG tablet  History of Lyme disease  - Plan: doxycycline (VIBRA-TABS) 100 MG tablet  Educated about Covid -Discussed signs and symptoms of COVID-19 virus infection -Pt advised to get COVID vaccination as can still get COVID despite previous infection. -Discussed r/b/a of COVID-19 vaccine. -Given current symptoms consider Covid testing  Follow-up next week with PCP.   I discussed the assessment and treatment plan with the patient. The patient was provided an opportunity to ask questions and all were answered. The patient agreed with the plan and demonstrated an understanding of the instructions.   The patient was advised to call back or seek an in-person evaluation if the symptoms worsen or if the condition fails to improve as anticipated.   Billie Ruddy, MD

## 2019-08-02 ENCOUNTER — Encounter: Payer: Self-pay | Admitting: Family Medicine

## 2019-08-06 ENCOUNTER — Telehealth: Payer: Self-pay | Admitting: Family Medicine

## 2019-08-06 NOTE — Telephone Encounter (Signed)
Pt seen for acute concern by this provider.  She had a recent tick bite with large area of erythema surrounding.  Per pt she has a h/o Lyme dz.  Given this we decided against the ppx dose of doxycycline.  Pt advised to f/u with her pcp.

## 2019-08-06 NOTE — Telephone Encounter (Signed)
Patient is calling in stating she seen Dr.Banks last week for tick bite and is being treated for Lyme's disease but would like to know if she can have a blood test done to confirm the diagnosis.

## 2019-08-06 NOTE — Telephone Encounter (Signed)
Please advise 

## 2019-08-09 ENCOUNTER — Telehealth: Payer: Self-pay | Admitting: Family Medicine

## 2019-08-09 NOTE — Telephone Encounter (Signed)
Lvm for pt to call office to schedule appt

## 2019-08-09 NOTE — Telephone Encounter (Signed)
Patient called Friday requesting to be tested for lyme disease. patient would like for someone to call her as soon as possible.

## 2019-08-09 NOTE — Telephone Encounter (Signed)
Please schedule patient a appt

## 2019-08-09 NOTE — Telephone Encounter (Signed)
Needs office visit. Please schedule with me for in person office visit. Last seen by me in 2019. Thanks.

## 2019-08-09 NOTE — Telephone Encounter (Signed)
Patient was calling to speak with Cleotis Lema. Previous phone note, patient needs to be scheduled for OV with Dr. Jonni Sanger. Please call patient to schedule.

## 2019-08-09 NOTE — Telephone Encounter (Signed)
Patient calling Ms Jacqueline Sloan Back.

## 2019-08-09 NOTE — Telephone Encounter (Signed)
Patient has called back requesting an order to be tested for lyme disease.  Stating that she does not believe Dr. Volanda Napoleon gave her the correct care.    I have informed patient that Dr. Volanda Napoleon has advised her to follow up with Dr. Jonni Sanger.    Patient is refusing to follow up with Dr. Jonni Sanger stating that she no longer wishes to see Dr. Jonni Sanger and is requesting to follow up with Dr. Volanda Napoleon on this matter even though she does not believe she was given proper care.   Patient states she is not happy with the care she is receiving from Dr. Jonni Sanger stating that she previously had established care at [facility].  I have asked patient if she would like to transfer care to the [facility] location.    Patient does not wish to transfer her care to any other provider.    Please advise.

## 2019-08-09 NOTE — Telephone Encounter (Signed)
Please advise if patient needs another visit or if test can be ordered?

## 2019-08-09 NOTE — Telephone Encounter (Signed)
Please address this. She can transfer care if she'd like.  I've seen her twice I believe? Last 2019.   I can't diagnose lymes disease without seeing her. Thanks.

## 2019-08-10 ENCOUNTER — Telehealth: Payer: Self-pay | Admitting: Family Medicine

## 2019-08-10 NOTE — Telephone Encounter (Signed)
Would Dr. Volanda Napoleon be able to schedule a follow up appointment in regard to last virtual visit?

## 2019-08-10 NOTE — Telephone Encounter (Signed)
Please be advised this provider will have to decline any further visits with pt.  As pt was seen for an acute visit and per pt's other communications seems unhappy with this provider's care she should f/u with her pcp.

## 2019-08-10 NOTE — Telephone Encounter (Signed)
I have spoken with patient this afternoon.    Please also see phone note dated 08/10/19.    I have advised patient that Dr. Volanda Napoleon has declined the lyme disease testing due to the fact that it would come back positive b/c she has had lyme disease before.    Patient understood.  Patient states she did go to UC last night and was tested for lyme disease.  I have asked patient if she would like me to assist with a transfer back to [facility].  Patient stated she did not want to go back to that office.    Patient states she is going to transfer to a different office but not sure which one right now.    I informed patient if she needed assistance with reaching out to our other office locations to let me know.

## 2019-08-10 NOTE — Telephone Encounter (Signed)
I have spoke with patient in regard.  I informed her that the reason Dr. Volanda Napoleon did not order a lyme disease test was due to the fact that she has had lyme disease and that it would come back positive.    Patient states she has gone to an UC to get tested for lyme disease.

## 2020-02-02 DIAGNOSIS — F331 Major depressive disorder, recurrent, moderate: Secondary | ICD-10-CM | POA: Diagnosis not present

## 2020-03-01 DIAGNOSIS — N926 Irregular menstruation, unspecified: Secondary | ICD-10-CM | POA: Diagnosis not present

## 2020-03-01 DIAGNOSIS — N7011 Chronic salpingitis: Secondary | ICD-10-CM | POA: Diagnosis not present

## 2020-03-01 DIAGNOSIS — R9389 Abnormal findings on diagnostic imaging of other specified body structures: Secondary | ICD-10-CM | POA: Diagnosis not present

## 2020-03-01 DIAGNOSIS — Z3201 Encounter for pregnancy test, result positive: Secondary | ICD-10-CM | POA: Diagnosis not present

## 2020-03-22 DIAGNOSIS — Z20822 Contact with and (suspected) exposure to covid-19: Secondary | ICD-10-CM | POA: Diagnosis not present

## 2020-05-29 DIAGNOSIS — N840 Polyp of corpus uteri: Secondary | ICD-10-CM | POA: Diagnosis not present

## 2020-05-29 DIAGNOSIS — Z3202 Encounter for pregnancy test, result negative: Secondary | ICD-10-CM | POA: Diagnosis not present

## 2020-05-29 DIAGNOSIS — D252 Subserosal leiomyoma of uterus: Secondary | ICD-10-CM | POA: Diagnosis not present

## 2020-05-29 DIAGNOSIS — R9389 Abnormal findings on diagnostic imaging of other specified body structures: Secondary | ICD-10-CM | POA: Diagnosis not present

## 2020-06-01 DIAGNOSIS — F331 Major depressive disorder, recurrent, moderate: Secondary | ICD-10-CM | POA: Diagnosis not present

## 2020-06-29 DIAGNOSIS — Z09 Encounter for follow-up examination after completed treatment for conditions other than malignant neoplasm: Secondary | ICD-10-CM | POA: Diagnosis not present

## 2020-07-31 DIAGNOSIS — N858 Other specified noninflammatory disorders of uterus: Secondary | ICD-10-CM | POA: Diagnosis not present

## 2020-07-31 DIAGNOSIS — N83291 Other ovarian cyst, right side: Secondary | ICD-10-CM | POA: Diagnosis not present

## 2020-09-18 DIAGNOSIS — N858 Other specified noninflammatory disorders of uterus: Secondary | ICD-10-CM | POA: Diagnosis not present

## 2020-09-18 DIAGNOSIS — N83201 Unspecified ovarian cyst, right side: Secondary | ICD-10-CM | POA: Diagnosis not present

## 2020-09-21 ENCOUNTER — Telehealth: Payer: Self-pay | Admitting: *Deleted

## 2020-09-21 NOTE — Telephone Encounter (Signed)
Spoke with the patient and scheduled a new patient appt for 6/28 with Dr Berline Lopes at 11:15 am. Patient given an arrival time of 10:45 am. Patient offered earlier appt but will be out of town the week of 6/20. Patient also given the policy for mask and visitors

## 2020-10-01 NOTE — Progress Notes (Signed)
GYNECOLOGIC ONCOLOGY NEW PATIENT CONSULTATION   Patient Name: Jacqueline Sloan  Patient Age: 55 y.o. Date of Service: 10/03/20 Referring Provider: Dr. Murrell Redden  Primary Care Provider: Leamon Arnt, MD Consulting Provider: Jeral Pinch, MD   Assessment/Plan:  Perimenopausal patient with complex adnexal mass on outside imaging.  I discussed outside ultrasound reports with the patient.  Unfortunately, because these were done outside of the [facility] health system, I cannot see images.  Given her known history of endometriosis, and long interval between pelvic imaging and recent ultrasounds this year, it is difficult to know if these are new findings.  The most recent ultrasound describes some enlargement of cystic area although only one cystic lesion noted in the right adnexa compared to 2 seen on the prior ultrasound.  I recommended some additional imaging to help determine recommendations moving forward including whether the patient needed to be followed with interval imaging surveillance versus the need for any diagnostic intervention (such as surgery).  We discussed that this could either be done with an ultrasound and if abnormal findings noted on ultrasound done to proceed with a pelvic MRI.  Given outside ultrasound reports, I also think it is appropriate to proceed with a pelvic MRI at this time.  I discussed the risks and benefits of both approaches with the patient and we came to the decision to proceed first with a pelvic ultrasound.  I will call her once I have the results and have reviewed the images and we will discuss recommendations for any additional work-up necessary at that time.  While the patient had a recent Ca1 25 that was normal, we discussed the difficulty with using this test as a diagnostic tool.  It can be normal and up to 50% of women with early stage ovarian cancer and can be elevated in many disease processes other than cancer, such as endometriosis or uterine  fibroids.  All the patient's questions and concerns were answered today.  A copy of this note was sent to the patient's referring provider.   55 minutes of total time was spent for this patient encounter, including preparation, face-to-face counseling with the patient and coordination of care, and documentation of the encounter.  Jeral Pinch, MD  Division of Gynecologic Oncology  Department of Obstetrics and Gynecology  [facility]  ___________________________________________  Chief Complaint: Chief Complaint  Patient presents with   Ovarian mass    History of Present Illness:  Jacqueline Sloan is a 54 y.o. y.o. female who is seen in consultation at the request of Dr. Murrell Redden for an evaluation of a complex adnexal mass.  The patient underwent polypectomy on 05/29/2020 with final pathology showing benign endometrial polyps, weakly proliferative endometrium.  No hyperplasia or malignancy.  Pelvic ultrasound at [facility] on 4/25 showed a uterus measuring 9 x 6 x 4 cm with a 6 x 4.5 x 6 cm anterior/lower uterine segment fibroid.  Right ovary was noted to have a complex cyst measuring 1.5 x 0.9 cm.  A second complex cystic area noted abutting the right ovary measuring 1.1 x 0.9 cm with internal blood flow.  Left ovary measured to be 4.1 x 1.8 x 1.4 cm.  Repeat pelvic ultrasound exam on 6/13 shows right ovary measures 2.5 x 1.2 x 1.2 cm.  Complex area abutting the right ovary has increased in size with internal vascularity measuring 1.6 x 1.1 x 1.2 cm.  No note of a second cystic mass on this exam.  Left ovary  is described as having an elongated appearance with a questionable ovarian fold.  This was described as being similar in appearance to previous study.  Left ovary measures 3.5 x 1.5 x 1.3 cm.  Patient describes having some heavier bleeding as well as increased hot flashes before her D&C and polypectomy procedure earlier this year.  She continues to have  menses although now goes 6 weeks or longer between menstrual cycles.  She continues to experience hot flashes.  She denies any pelvic or abdominal pain.  She reports having a good appetite without any recent weight changes.  She denies nausea or emesis.  She reports normal bowel and bladder function.  She describes having some mild urinary stress incontinence.  Her GYN history is notable for diagnosis of endometriosis in 2001 which was diagnosed on diagnostic laparoscopy at [facility].  She ultimately delivered twins, with 1 vaginal delivery and 1 delivered by C-section.  PAST MEDICAL HISTORY:  Past Medical History:  Diagnosis Date   ALLERGIC RHINITIS 12/17/2006   ANXIETY 12/17/2006   Depression    GEN OSTEOARTHROSIS INVOLVING MULTIPLE SITES 12/29/2008   Major depression, recurrent, chronic ([place]) 06/03/2017   Managed by Noemi Chapel, FNP psych   Migraine    Migraine without aura or status migrainosus 04/18/2016   Preventive with lamictal   Myofascial pain syndrome 04/18/2016   Nausea with vomiting 02/28/2008   Qualifier: Diagnosis of  By: Sherren Mocha MD, Jory Ee    Other fatigue 04/18/2016   Primary insomnia 04/18/2016   Primary osteoarthritis of both feet 04/18/2016   Primary osteoarthritis of both hands 04/18/2016   Viral URI 02/11/2011     PAST SURGICAL HISTORY:  Past Surgical History:  Procedure Laterality Date   CESAREAN SECTION  2002   DIAGNOSTIC LAPAROSCOPY     Diagnosed with endometriosis in approximately 2000   TONSILECTOMY, ADENOIDECTOMY, BILATERAL MYRINGOTOMY AND TUBES  2014    OB/GYN HISTORY:  OB History  Gravida Para Term Preterm AB Living  1 1       2   SAB IAB Ectopic Multiple Live Births               # Outcome Date GA Lbr Len/2nd Weight Sex Delivery Anes PTL Lv  1 Para             No LMP recorded.  Age at menarche: 30 Age at menopause: n/a Hx of HRT: n/a Hx of STDs: Diagnosed with herpes in college, reports very infrequent outbreaks Last pap: 2022 History of  abnormal pap smears: Denies  SCREENING STUDIES:  Last mammogram: 2015  Last colonoscopy: 2019  MEDICATIONS: Outpatient Encounter Medications as of 10/03/2020  Medication Sig   cycloSPORINE (RESTASIS) 0.05 % ophthalmic emulsion as needed.   [DISCONTINUED] ALPRAZolam (XANAX) 0.25 MG tablet Take 0.25 mg by mouth at bedtime as needed for anxiety.   [DISCONTINUED] escitalopram (LEXAPRO) 20 MG tablet    [DISCONTINUED] lamoTRIgine (LAMICTAL) 150 MG tablet Take 150 mg by mouth daily.   [DISCONTINUED] meloxicam (MOBIC) 15 MG tablet TAKE 1 TABLET BY MOUTH EVERY DAY (Patient not taking: Reported on 02/24/2019)   [DISCONTINUED] rizatriptan (MAXALT-MLT) 10 MG disintegrating tablet as needed.    [DISCONTINUED] tobramycin-dexamethasone (TOBRADEX) ophthalmic solution INSTILL 1 DROP INTO LEFT EYE 4 TIMES A DAY AS DIRECTED   [DISCONTINUED] zolpidem (AMBIEN) 5 MG tablet as needed.    No facility-administered encounter medications on file as of 10/03/2020.    ALLERGIES:  Allergies  Allergen Reactions   Penicillins Other (See Comments)  As a child, has taken penicillins as an adult with no problems      FAMILY HISTORY:  Family History  Problem Relation Age of Onset   Hypertension Mother    Congestive Heart Failure Mother    Arthritis Mother    Rheum arthritis Maternal Grandmother    Arthritis Maternal Grandfather    Prostate cancer Maternal Grandfather    Healthy Son    Healthy Son    Breast cancer Other    Endometrial cancer Neg Hx    Ovarian cancer Neg Hx    Pancreatic cancer Neg Hx    Colon cancer Neg Hx      SOCIAL HISTORY:  Social Connections: Not on file    REVIEW OF SYSTEMS:  + hot flashes Denies appetite changes, fevers, chills, fatigue, unexplained weight changes. Denies hearing loss, neck lumps or masses, mouth sores, ringing in ears or voice changes. Denies cough or wheezing.  Denies shortness of breath. Denies chest pain or palpitations. Denies leg swelling. Denies  abdominal distention, pain, blood in stools, constipation, diarrhea, nausea, vomiting, or early satiety. Denies pain with intercourse, dysuria, frequency, hematuria or incontinence. Denies pelvic pain, vaginal bleeding or vaginal discharge.   Denies joint pain, back pain or muscle pain/cramps. Denies itching, rash, or wounds. Denies dizziness, headaches, numbness or seizures. Denies swollen lymph nodes or glands, denies easy bruising or bleeding. Denies anxiety, depression, confusion, or decreased concentration.  Physical Exam:  Vital Signs for this encounter:  Blood pressure 122/81, pulse 85, temperature (!) 97 F (36.1 C), temperature source Tympanic, resp. rate 18, height 5\' 6"  (1.676 m), weight 151 lb (68.5 kg), SpO2 98 %. Body mass index is 24.37 kg/m. General: Alert, oriented, no acute distress.  HEENT: Normocephalic, atraumatic. Sclera anicteric.  Chest: Clear to auscultation bilaterally. No wheezes, rhonchi, or rales. Cardiovascular: Regular rate and rhythm, no murmurs, rubs, or gallops.  Abdomen: Normoactive bowel sounds. Soft, nondistended, nontender to palpation. No masses or hepatosplenomegaly appreciated. No palpable fluid wave.  Well-healed Pfannenstiel incision. Extremities: Grossly normal range of motion. Warm, well perfused. No edema bilaterally.  Skin: No rashes or lesions.  Lymphatics: No cervical, supraclavicular, or inguinal adenopathy.  GU:  Normal external female genitalia. No lesions. No discharge or bleeding.             Bladder/urethra:  No lesions or masses, well supported bladder             Vagina: Well rugated, no lesions or masses.             Cervix: Normal appearing, no lesions.             Uterus: Mid position, firmness appreciated anteriorly, likely anterior fibroid.  Uterus is mobile, no parametrial involvement or nodularity.             Adnexa: No masses appreciated.  Rectal: Confirms these findings.  LABORATORY AND RADIOLOGIC DATA:  Outside  medical records were reviewed to synthesize the above history, along with the history and physical obtained during the visit.   Lab Results  Component Value Date   WBC 3.8 02/06/2018   HGB 13.7 02/06/2018   HCT 40.6 02/06/2018   PLT 278 02/06/2018   GLUCOSE 91 02/06/2018   CHOL 199 06/03/2017   TRIG 117.0 06/03/2017   HDL 48.80 06/03/2017   LDLCALC 126 (H) 06/03/2017   ALT 18 02/06/2018   AST 20 02/06/2018   NA 139 02/06/2018   K 4.1 02/06/2018   CL 104 02/06/2018  CREATININE 0.68 02/06/2018   BUN 10 02/06/2018   CO2 28 02/06/2018   TSH 0.89 06/03/2017   HGBA1C 5.2 07/08/2012

## 2020-10-02 ENCOUNTER — Encounter: Payer: Self-pay | Admitting: Gynecologic Oncology

## 2020-10-03 ENCOUNTER — Other Ambulatory Visit: Payer: Self-pay

## 2020-10-03 ENCOUNTER — Inpatient Hospital Stay: Payer: Federal, State, Local not specified - PPO | Attending: Gynecologic Oncology | Admitting: Gynecologic Oncology

## 2020-10-03 ENCOUNTER — Encounter: Payer: Self-pay | Admitting: Gynecologic Oncology

## 2020-10-03 VITALS — BP 122/81 | HR 85 | Temp 97.0°F | Resp 18 | Ht 66.0 in | Wt 151.0 lb

## 2020-10-03 DIAGNOSIS — N838 Other noninflammatory disorders of ovary, fallopian tube and broad ligament: Secondary | ICD-10-CM

## 2020-10-03 DIAGNOSIS — M19071 Primary osteoarthritis, right ankle and foot: Secondary | ICD-10-CM | POA: Insufficient documentation

## 2020-10-03 DIAGNOSIS — M7918 Myalgia, other site: Secondary | ICD-10-CM | POA: Diagnosis not present

## 2020-10-03 DIAGNOSIS — M19072 Primary osteoarthritis, left ankle and foot: Secondary | ICD-10-CM | POA: Diagnosis not present

## 2020-10-03 DIAGNOSIS — F339 Major depressive disorder, recurrent, unspecified: Secondary | ICD-10-CM | POA: Insufficient documentation

## 2020-10-03 DIAGNOSIS — D3911 Neoplasm of uncertain behavior of right ovary: Secondary | ICD-10-CM | POA: Diagnosis not present

## 2020-10-03 NOTE — Patient Instructions (Signed)
It was a pleasure meeting you today!  I will give you a call when I get the ultrasound results back.  Depending on what the ultrasound shows, we may discuss getting a pelvic MRI to better assess the ovaries and surrounding tissue.  In the meantime, please let me know if you develop any new or concerning symptoms.

## 2020-10-05 NOTE — Addendum Note (Signed)
Addended by: Lafonda Mosses on: 10/05/2020 11:03 AM   Modules accepted: Level of Service

## 2020-10-10 ENCOUNTER — Ambulatory Visit (HOSPITAL_COMMUNITY)
Admission: RE | Admit: 2020-10-10 | Discharge: 2020-10-10 | Disposition: A | Discharge status: Home / Self Care | Payer: Federal, State, Local not specified - PPO | Source: Ambulatory Visit | Attending: Gynecologic Oncology | Admitting: Gynecologic Oncology

## 2020-10-10 ENCOUNTER — Other Ambulatory Visit: Payer: Self-pay

## 2020-10-10 DIAGNOSIS — R1909 Other intra-abdominal and pelvic swelling, mass and lump: Secondary | ICD-10-CM | POA: Diagnosis not present

## 2020-10-10 DIAGNOSIS — N838 Other noninflammatory disorders of ovary, fallopian tube and broad ligament: Secondary | ICD-10-CM

## 2020-10-11 ENCOUNTER — Telehealth: Payer: Self-pay | Admitting: *Deleted

## 2020-10-11 ENCOUNTER — Encounter: Payer: Self-pay | Admitting: Gynecologic Oncology

## 2020-10-11 ENCOUNTER — Other Ambulatory Visit: Payer: Self-pay | Admitting: Gynecologic Oncology

## 2020-10-11 DIAGNOSIS — N838 Other noninflammatory disorders of ovary, fallopian tube and broad ligament: Secondary | ICD-10-CM

## 2020-10-11 DIAGNOSIS — N9489 Other specified conditions associated with female genital organs and menstrual cycle: Secondary | ICD-10-CM

## 2020-10-11 NOTE — Telephone Encounter (Signed)
Patient called the office and stated "I do want the MRI Dr Berline Lopes talked about." Message forwarded to Dr Berline Lopes

## 2020-10-12 DIAGNOSIS — M7731 Calcaneal spur, right foot: Secondary | ICD-10-CM | POA: Diagnosis not present

## 2020-10-12 DIAGNOSIS — M7732 Calcaneal spur, left foot: Secondary | ICD-10-CM | POA: Diagnosis not present

## 2020-10-12 DIAGNOSIS — M722 Plantar fascial fibromatosis: Secondary | ICD-10-CM | POA: Diagnosis not present

## 2020-10-13 ENCOUNTER — Telehealth: Payer: Self-pay | Admitting: *Deleted

## 2020-10-13 NOTE — Telephone Encounter (Signed)
Scheduled the patient for a MRI and called the patient with the appt date/time/instructions

## 2020-10-18 ENCOUNTER — Ambulatory Visit (HOSPITAL_COMMUNITY)
Admission: RE | Admit: 2020-10-18 | Discharge: 2020-10-18 | Disposition: A | Discharge status: Home / Self Care | Payer: Federal, State, Local not specified - PPO | Source: Ambulatory Visit | Attending: Gynecologic Oncology | Admitting: Gynecologic Oncology

## 2020-10-18 ENCOUNTER — Other Ambulatory Visit: Payer: Self-pay

## 2020-10-18 DIAGNOSIS — N9489 Other specified conditions associated with female genital organs and menstrual cycle: Secondary | ICD-10-CM | POA: Insufficient documentation

## 2020-10-18 DIAGNOSIS — R1900 Intra-abdominal and pelvic swelling, mass and lump, unspecified site: Secondary | ICD-10-CM | POA: Diagnosis not present

## 2020-10-18 DIAGNOSIS — N83202 Unspecified ovarian cyst, left side: Secondary | ICD-10-CM | POA: Diagnosis not present

## 2020-10-18 MED ORDER — GADOBUTROL 1 MMOL/ML IV SOLN
7.0000 mL | Freq: Once | INTRAVENOUS | Status: AC | PRN
  Administered 2020-10-18: 7 mL via INTRAVENOUS

## 2020-10-19 DIAGNOSIS — M722 Plantar fascial fibromatosis: Secondary | ICD-10-CM | POA: Diagnosis not present

## 2020-10-23 ENCOUNTER — Telehealth: Payer: Self-pay | Admitting: Gynecologic Oncology

## 2020-10-23 NOTE — Telephone Encounter (Signed)
I called the patient and reviewed pelvic MRI findings.  Mass is most consistent with a pedunculated fibroid, separate from normal-appearing adnexa.  I think it would be reasonable to repeat imaging in the future if the patient began to have pelvic symptoms.  I do not think that surgery is indicated at this time.  Patient is overall very happy to hear this news and in agreement with plan to follow-up for GYN care with Dr. Murrell Redden.  Valarie Cones MD

## 2020-10-26 DIAGNOSIS — M722 Plantar fascial fibromatosis: Secondary | ICD-10-CM | POA: Diagnosis not present

## 2020-12-20 DIAGNOSIS — M5136 Other intervertebral disc degeneration, lumbar region: Secondary | ICD-10-CM | POA: Diagnosis not present

## 2020-12-20 DIAGNOSIS — M5134 Other intervertebral disc degeneration, thoracic region: Secondary | ICD-10-CM | POA: Diagnosis not present

## 2020-12-20 DIAGNOSIS — M549 Dorsalgia, unspecified: Secondary | ICD-10-CM | POA: Diagnosis not present

## 2020-12-25 DIAGNOSIS — Z1231 Encounter for screening mammogram for malignant neoplasm of breast: Secondary | ICD-10-CM | POA: Diagnosis not present

## 2020-12-28 DIAGNOSIS — M25552 Pain in left hip: Secondary | ICD-10-CM | POA: Diagnosis not present

## 2020-12-28 DIAGNOSIS — M6281 Muscle weakness (generalized): Secondary | ICD-10-CM | POA: Diagnosis not present

## 2020-12-28 DIAGNOSIS — M5451 Vertebrogenic low back pain: Secondary | ICD-10-CM | POA: Diagnosis not present

## 2020-12-28 DIAGNOSIS — M2560 Stiffness of unspecified joint, not elsewhere classified: Secondary | ICD-10-CM | POA: Diagnosis not present

## 2021-01-01 DIAGNOSIS — M5451 Vertebrogenic low back pain: Secondary | ICD-10-CM | POA: Diagnosis not present

## 2021-01-01 DIAGNOSIS — M25552 Pain in left hip: Secondary | ICD-10-CM | POA: Diagnosis not present

## 2021-01-01 DIAGNOSIS — M6281 Muscle weakness (generalized): Secondary | ICD-10-CM | POA: Diagnosis not present

## 2021-01-01 DIAGNOSIS — M2560 Stiffness of unspecified joint, not elsewhere classified: Secondary | ICD-10-CM | POA: Diagnosis not present

## 2021-01-05 DIAGNOSIS — M6281 Muscle weakness (generalized): Secondary | ICD-10-CM | POA: Diagnosis not present

## 2021-01-05 DIAGNOSIS — M2560 Stiffness of unspecified joint, not elsewhere classified: Secondary | ICD-10-CM | POA: Diagnosis not present

## 2021-01-05 DIAGNOSIS — M5451 Vertebrogenic low back pain: Secondary | ICD-10-CM | POA: Diagnosis not present

## 2021-01-05 DIAGNOSIS — M25552 Pain in left hip: Secondary | ICD-10-CM | POA: Diagnosis not present

## 2021-01-08 DIAGNOSIS — M6281 Muscle weakness (generalized): Secondary | ICD-10-CM | POA: Diagnosis not present

## 2021-01-08 DIAGNOSIS — M5451 Vertebrogenic low back pain: Secondary | ICD-10-CM | POA: Diagnosis not present

## 2021-01-08 DIAGNOSIS — M25552 Pain in left hip: Secondary | ICD-10-CM | POA: Diagnosis not present

## 2021-01-08 DIAGNOSIS — M2560 Stiffness of unspecified joint, not elsewhere classified: Secondary | ICD-10-CM | POA: Diagnosis not present

## 2021-01-11 DIAGNOSIS — M5451 Vertebrogenic low back pain: Secondary | ICD-10-CM | POA: Diagnosis not present

## 2021-01-11 DIAGNOSIS — M6281 Muscle weakness (generalized): Secondary | ICD-10-CM | POA: Diagnosis not present

## 2021-01-11 DIAGNOSIS — M2560 Stiffness of unspecified joint, not elsewhere classified: Secondary | ICD-10-CM | POA: Diagnosis not present

## 2021-01-11 DIAGNOSIS — M25552 Pain in left hip: Secondary | ICD-10-CM | POA: Diagnosis not present

## 2021-01-15 DIAGNOSIS — M6281 Muscle weakness (generalized): Secondary | ICD-10-CM | POA: Diagnosis not present

## 2021-01-15 DIAGNOSIS — M25552 Pain in left hip: Secondary | ICD-10-CM | POA: Diagnosis not present

## 2021-01-15 DIAGNOSIS — M2560 Stiffness of unspecified joint, not elsewhere classified: Secondary | ICD-10-CM | POA: Diagnosis not present

## 2021-01-15 DIAGNOSIS — M5451 Vertebrogenic low back pain: Secondary | ICD-10-CM | POA: Diagnosis not present

## 2021-01-22 DIAGNOSIS — M5451 Vertebrogenic low back pain: Secondary | ICD-10-CM | POA: Diagnosis not present

## 2021-01-22 DIAGNOSIS — M6281 Muscle weakness (generalized): Secondary | ICD-10-CM | POA: Diagnosis not present

## 2021-01-22 DIAGNOSIS — M25552 Pain in left hip: Secondary | ICD-10-CM | POA: Diagnosis not present

## 2021-01-22 DIAGNOSIS — M2560 Stiffness of unspecified joint, not elsewhere classified: Secondary | ICD-10-CM | POA: Diagnosis not present

## 2021-01-25 DIAGNOSIS — M5451 Vertebrogenic low back pain: Secondary | ICD-10-CM | POA: Diagnosis not present

## 2021-01-25 DIAGNOSIS — M25552 Pain in left hip: Secondary | ICD-10-CM | POA: Diagnosis not present

## 2021-01-25 DIAGNOSIS — M6281 Muscle weakness (generalized): Secondary | ICD-10-CM | POA: Diagnosis not present

## 2021-01-25 DIAGNOSIS — M2560 Stiffness of unspecified joint, not elsewhere classified: Secondary | ICD-10-CM | POA: Diagnosis not present

## 2021-02-05 DIAGNOSIS — M6281 Muscle weakness (generalized): Secondary | ICD-10-CM | POA: Diagnosis not present

## 2021-02-05 DIAGNOSIS — M2560 Stiffness of unspecified joint, not elsewhere classified: Secondary | ICD-10-CM | POA: Diagnosis not present

## 2021-02-05 DIAGNOSIS — M25552 Pain in left hip: Secondary | ICD-10-CM | POA: Diagnosis not present

## 2021-02-05 DIAGNOSIS — M5451 Vertebrogenic low back pain: Secondary | ICD-10-CM | POA: Diagnosis not present

## 2021-03-08 DIAGNOSIS — G43109 Migraine with aura, not intractable, without status migrainosus: Secondary | ICD-10-CM | POA: Diagnosis not present

## 2021-04-21 DIAGNOSIS — N914 Secondary oligomenorrhea: Secondary | ICD-10-CM | POA: Diagnosis not present

## 2021-04-21 DIAGNOSIS — Z6824 Body mass index (BMI) 24.0-24.9, adult: Secondary | ICD-10-CM | POA: Diagnosis not present

## 2021-04-21 DIAGNOSIS — N390 Urinary tract infection, site not specified: Secondary | ICD-10-CM | POA: Diagnosis not present

## 2021-06-01 NOTE — Progress Notes (Signed)
Office Visit Note  Patient: Jacqueline Sloan             Date of Birth: Sep 11, 1965           MRN: [id number]             PCP: Jacqueline Arnt, MD Referring: Jacqueline Arnt, MD Visit Date: 06/06/2021 Occupation: @GUAROCC @  Subjective:  Other (Pain in bilateral feet)   History of Present Illness: Jacqueline Sloan is a 56 y.o. female with a history of myofascial pain syndrome and osteoarthritis.  She states she continues to have pain and discomfort in her joints.  The pain in her bilateral feet has increased in the last few months.  She states she was seen by Dr. Gershon Sloan podiatrist 2 months ago.  He diagnosed her with bursitis in her feet.  She had several cortisone injections in her heels.  She was also put in some boots and some braces for about a week.  He advised her not to walk barefoot and she has been wearing shoes since then.  She also has shoes with orthotics now.  She continues to have discomfort in her feet.  None of the other joints or is painful.  Fibromyalgia symptoms are controlled on gabapentin.  Activities of Daily Living:  Patient reports morning stiffness for all day. Patient Reports nocturnal pain.  Difficulty dressing/grooming: Denies Difficulty climbing stairs: Reports Difficulty getting out of chair: Reports Difficulty using hands for taps, buttons, cutlery, and/or writing: Denies  Review of Systems  Constitutional:  Positive for fatigue.  HENT:  Negative for mouth sores, mouth dryness and nose dryness.   Eyes:  Negative for pain, itching and dryness.  Respiratory:  Negative for shortness of breath and difficulty breathing.   Cardiovascular:  Negative for chest pain and palpitations.  Gastrointestinal:  Negative for blood in stool, constipation and diarrhea.  Endocrine: Negative for increased urination.  Genitourinary:  Negative for difficulty urinating.  Musculoskeletal:  Positive for joint pain, joint pain and morning stiffness. Negative for joint swelling,  myalgias, muscle tenderness and myalgias.  Skin:  Negative for color change, rash and redness.  Allergic/Immunologic: Negative for susceptible to infections.  Neurological:  Positive for memory loss. Negative for dizziness, numbness, headaches and weakness.  Hematological:  Positive for bruising/bleeding tendency.  Psychiatric/Behavioral:  Negative for confusion.    PMFS History:  Patient Active Problem List   Diagnosis Date Noted   Major depression, recurrent, chronic ([place]) 06/03/2017   Myofascial pain syndrome 04/18/2016   Primary osteoarthritis of both hands 04/18/2016   Primary osteoarthritis of both feet 04/18/2016   Migraine without aura or status migrainosus 04/18/2016   Primary insomnia 04/18/2016   ALLERGIC RHINITIS 12/17/2006    Past Medical History:  Diagnosis Date   ALLERGIC RHINITIS 12/17/2006   ANXIETY 12/17/2006   Depression    GEN OSTEOARTHROSIS INVOLVING MULTIPLE SITES 12/29/2008   Major depression, recurrent, chronic ([place]) 06/03/2017   Managed by Jacqueline Chapel, FNP psych   Migraine    Migraine without aura or status migrainosus 04/18/2016   Preventive with lamictal   Myofascial pain syndrome 04/18/2016   Nausea with vomiting 02/28/2008   Qualifier: Diagnosis of  By: Jacqueline Mocha MD, Jacqueline Sloan    Other fatigue 04/18/2016   Primary insomnia 04/18/2016   Primary osteoarthritis of both feet 04/18/2016   Primary osteoarthritis of both hands 04/18/2016   Viral URI 02/11/2011    Family History  Problem Relation Age of Onset   Hypertension Mother  Congestive Heart Failure Mother    Arthritis Mother    Rheum arthritis Maternal Grandmother    Arthritis Maternal Grandfather    Prostate cancer Maternal Grandfather    Healthy Son    Healthy Son    Breast cancer Other    Endometrial cancer Neg Hx    Ovarian cancer Neg Hx    Pancreatic cancer Neg Hx    Colon cancer Neg Hx    Past Surgical History:  Procedure Laterality Date   CESAREAN SECTION  2002   DIAGNOSTIC  LAPAROSCOPY     Diagnosed with endometriosis in approximately 2000   TONSILECTOMY, ADENOIDECTOMY, BILATERAL MYRINGOTOMY AND TUBES  2014   uterine tumor removed     Social History   Social History Narrative   Not on file   Immunization History  Administered Date(s) Administered   Td 04/09/1999, 05/03/2010     Objective: Vital Signs: BP 104/71 (BP Location: Left Arm, Patient Position: Sitting, Cuff Size: Normal)    Pulse 76    Ht 5' 6.5" (1.689 m)    Wt 150 lb 3.2 oz (68.1 kg)    BMI 23.88 kg/m    Physical Exam Vitals and nursing note reviewed.  Constitutional:      Appearance: She is well-developed.  HENT:     Head: Normocephalic and atraumatic.  Eyes:     Conjunctiva/sclera: Conjunctivae normal.  Cardiovascular:     Rate and Rhythm: Normal rate and regular rhythm.     Heart sounds: Normal heart sounds.  Pulmonary:     Effort: Pulmonary effort is normal.     Breath sounds: Normal breath sounds.  Abdominal:     General: Bowel sounds are normal.     Palpations: Abdomen is soft.  Musculoskeletal:     Cervical back: Normal range of motion.  Lymphadenopathy:     Cervical: No cervical adenopathy.  Skin:    General: Skin is warm and dry.     Capillary Refill: Capillary refill takes less than 2 seconds.  Neurological:     Mental Status: She is alert and oriented to person, place, and time.  Psychiatric:        Behavior: Behavior normal.     Musculoskeletal Exam: C-spine was in good range of motion.  Shoulder joints, elbow joints, wrist joints with good range of motion.  She had bilateral DIP thickening.  Hip joints and knee joints in good range of motion without any warmth swelling or effusion.  She had tenderness on palpation of bilateral subtalar and calcaneal region.  There was no tenderness over plantar fascia.  PIP and DIP thickening and high arches were noted.  No synovitis was noted.  CDAI Exam: CDAI Score: -- Patient Global: --; Provider Global: -- Swollen: --;  Tender: -- Joint Exam 06/06/2021   No joint exam has been documented for this visit   There is currently no information documented on the homunculus. Go to the Rheumatology activity and complete the homunculus joint exam.  Investigation: No additional findings.  Imaging: No results found.  Recent Labs: Lab Results  Component Value Date   WBC 3.8 02/06/2018   HGB 13.7 02/06/2018   PLT 278 02/06/2018   NA 139 02/06/2018   K 4.1 02/06/2018   CL 104 02/06/2018   CO2 28 02/06/2018   GLUCOSE 91 02/06/2018   BUN 10 02/06/2018   CREATININE 0.68 02/06/2018   BILITOT 0.4 02/06/2018   ALKPHOS 50 10/16/2016   AST 20 02/06/2018   ALT 18 02/06/2018  PROT 6.9 02/06/2018   ALBUMIN 4.2 10/16/2016   CALCIUM 9.3 02/06/2018   GFRAA 117 02/06/2018    Speciality Comments: No specialty comments available.  Procedures:  No procedures performed Allergies: Penicillins   Assessment / Plan:     Visit Diagnoses: Myofascial pain syndrome-she continues to have generalized pain and discomfort.  She states the symptoms are controlled on gabapentin.  Primary osteoarthritis of both hands-DIP and PIP thickening was noted.  Joint protection muscle strengthening was discussed.  Primary osteoarthritis of both knees - XR on 08/24/18-mild OA.  She has off-and-on discomfort in her knee joints.  No warmth swelling or effusion was noted.  Lower extremity muscle strengthening exercises were discussed.  Primary osteoarthritis of both feet - The x-rays were consistent with mild osteoarthritic changes.  Patient states she has been experiencing increased pain and discomfort in her bilateral feet to the point she was having difficulty walking.  She was seen by Dr. Gershon Sloan 2 months ago who diagnosed her with subtalar bursitis per patient.  I do not have the records to review.  She has had several injections in her ankles without much relief.  She states she was also in a boot for a month.  As she has modified her shoes  and also using orthotics.  I will refer her to physical therapy.  Some of the feet exercises were demonstrated in the office.  She may also benefit from Cymbalta as it relieves musculoskeletal pain.  I have advised her to discuss this further with the psychiatrist.- Plan: Ambulatory referral to Physical Therapy  Other fatigue-she continues to have some fatigue due to myofascial pain.  Primary insomnia-good sleep hygiene was discussed.  Other medical problems are listed as follows:  History of migraine  History of anxiety-she takes Xanax.  History of depression-she is on Lamictal  Orders: Orders Placed This Encounter  Procedures   Ambulatory referral to Physical Therapy   No orders of the defined types were placed in this encounter.    Follow-Up Instructions: Return in about 6 months (around 12/07/2021) for Osteoarthritis.   Bo Merino, MD  Note - This record has been created using Editor, commissioning.  Chart creation errors have been sought, but may not always  have been located. Such creation errors do not reflect on  the standard of medical care.

## 2021-06-06 ENCOUNTER — Encounter: Payer: Self-pay | Admitting: Rheumatology

## 2021-06-06 ENCOUNTER — Ambulatory Visit: Payer: Federal, State, Local not specified - PPO | Admitting: Rheumatology

## 2021-06-06 ENCOUNTER — Other Ambulatory Visit: Payer: Self-pay

## 2021-06-06 VITALS — BP 104/71 | HR 76 | Ht 66.5 in | Wt 150.2 lb

## 2021-06-06 DIAGNOSIS — Z8659 Personal history of other mental and behavioral disorders: Secondary | ICD-10-CM

## 2021-06-06 DIAGNOSIS — M19041 Primary osteoarthritis, right hand: Secondary | ICD-10-CM

## 2021-06-06 DIAGNOSIS — M19071 Primary osteoarthritis, right ankle and foot: Secondary | ICD-10-CM

## 2021-06-06 DIAGNOSIS — M19072 Primary osteoarthritis, left ankle and foot: Secondary | ICD-10-CM

## 2021-06-06 DIAGNOSIS — M19042 Primary osteoarthritis, left hand: Secondary | ICD-10-CM

## 2021-06-06 DIAGNOSIS — Z8669 Personal history of other diseases of the nervous system and sense organs: Secondary | ICD-10-CM

## 2021-06-06 DIAGNOSIS — F5101 Primary insomnia: Secondary | ICD-10-CM

## 2021-06-06 DIAGNOSIS — R5383 Other fatigue: Secondary | ICD-10-CM

## 2021-06-06 DIAGNOSIS — M7918 Myalgia, other site: Secondary | ICD-10-CM

## 2021-06-06 DIAGNOSIS — M170 Bilateral primary osteoarthritis of knee: Secondary | ICD-10-CM

## 2021-06-18 ENCOUNTER — Encounter: Payer: Self-pay | Admitting: Physical Therapy

## 2021-06-18 ENCOUNTER — Ambulatory Visit: Payer: Federal, State, Local not specified - PPO | Admitting: Physical Therapy

## 2021-06-18 DIAGNOSIS — R2689 Other abnormalities of gait and mobility: Secondary | ICD-10-CM | POA: Diagnosis not present

## 2021-06-18 DIAGNOSIS — M25572 Pain in left ankle and joints of left foot: Secondary | ICD-10-CM

## 2021-06-18 DIAGNOSIS — M25571 Pain in right ankle and joints of right foot: Secondary | ICD-10-CM

## 2021-06-18 NOTE — Therapy (Incomplete)
OUTPATIENT PHYSICAL THERAPY LOWER EXTREMITY EVALUATION   Patient Name: Jacqueline Sloan MRN: [id number] DOB:Aug 27, 1965, 56 y.o., female Today's Date: 06/18/2021   PT End of Session - 06/18/21 1203     Visit Number 1    Number of Visits 12    Date for PT Re-Evaluation 08/13/21    Authorization Type BCBS    PT Start Time 1105    PT Stop Time 1148    PT Time Calculation (min) 43 min    Activity Tolerance Patient tolerated treatment well    Behavior During Therapy WFL for tasks assessed/performed             Past Medical History:  Diagnosis Date   ALLERGIC RHINITIS 12/17/2006   ANXIETY 12/17/2006   Depression    GEN OSTEOARTHROSIS INVOLVING MULTIPLE SITES 12/29/2008   Major depression, recurrent, chronic ([place]) 06/03/2017   Managed by Noemi Chapel, FNP psych   Migraine    Migraine without aura or status migrainosus 04/18/2016   Preventive with lamictal   Myofascial pain syndrome 04/18/2016   Nausea with vomiting 02/28/2008   Qualifier: Diagnosis of  By: Sherren Mocha MD, Jory Ee    Other fatigue 04/18/2016   Primary insomnia 04/18/2016   Primary osteoarthritis of both feet 04/18/2016   Primary osteoarthritis of both hands 04/18/2016   Viral URI 02/11/2011   Past Surgical History:  Procedure Laterality Date   CESAREAN SECTION  2002   DIAGNOSTIC LAPAROSCOPY     Diagnosed with endometriosis in approximately 2000   TONSILECTOMY, ADENOIDECTOMY, BILATERAL MYRINGOTOMY AND TUBES  2014   uterine tumor removed     Patient Active Problem List   Diagnosis Date Noted   Major depression, recurrent, chronic ([place]) 06/03/2017   Myofascial pain syndrome 04/18/2016   Primary osteoarthritis of both hands 04/18/2016   Primary osteoarthritis of both feet 04/18/2016   Migraine without aura or status migrainosus 04/18/2016   Primary insomnia 04/18/2016   ALLERGIC RHINITIS 12/17/2006    PCP: Leamon Arnt, MD  REFERRING PROVIDER: Bo Merino, MD  REFERRING DIAG: OA bil feet    THERAPY DIAG:  Pain in left ankle and joints of left foot  Pain in right ankle and joints of right foot  Other abnormalities of gait and mobility  ONSET DATE:  6-8 mo ago  SUBJECTIVE:   SUBJECTIVE STATEMENT:  Pt with main concern for Bil foot Pain.  6-8 months ago, no injury.  Has seen Rheumatology for years, has  also seen Dr. Gershon Mussel podiatry recently, about 3 months ago, was in boots, Dorsal splint for sleep, and Has had several injections in foot/ankle. She thinks injections did help, but only temporary. Now still having pain, worst in AM, and when on feet for a long time.  Pt works full time at [employer]. Works from home, on Teaching laboratory technician. Did stand on concrete for years prior. She is algo very active Doing house work/flipping house.Does have sneakers/glycerin that she finds comfortable. Has several orthotics that she brought in today.   Pt reports bone spurs and bursitis from previous X-ray at podiatry.   PERTINENT HISTORY: ***  PAIN:  Are you having pain? Yes: NPRS scale: 7/10 Pain location: Bil central heel and fat pad Pain description: Aching, sore Aggravating factors: First thing in AM, increased standing/walking Relieving factors: Rest  PRECAUTIONS: None  WEIGHT BEARING RESTRICTIONS No  FALLS:  Has patient fallen in last 6 months? No, Number of falls: 0   PLOF: Independent  PATIENT GOALS ***   OBJECTIVE:  COGNITION:  Overall cognitive status: Within functional limits for tasks assessed      POSTURE:  ***  PALPATION: ***  LE ROM:  {AROM/PROM:27142} ROM Right 06/18/2021 Left 06/18/2021  Hip flexion    Hip extension    Hip abduction    Hip adduction    Hip internal rotation    Hip external rotation    Knee flexion    Knee extension    Ankle dorsiflexion    Ankle plantarflexion    Ankle inversion    Ankle eversion     (Blank rows = not tested)  LE MMT:  MMT Right 06/18/2021 Left 06/18/2021  Hip flexion    Hip extension    Hip  abduction    Hip adduction    Hip internal rotation    Hip external rotation    Knee flexion    Knee extension    Ankle dorsiflexion    Ankle plantarflexion    Ankle inversion    Ankle eversion     (Blank rows = not tested)  LOWER EXTREMITY SPECIAL TESTS:  {LEspecialtests:26242}  FUNCTIONAL TESTS:  {Functional tests:24029}  GAIT: Distance walked: *** Assistive device utilized: {Assistive devices:23999} Level of assistance: {Levels of assistance:24026} Comments: ***    TODAY'S TREATMENT: ***   PATIENT EDUCATION:  Education details: *** Person educated: Patient Education method: Consulting civil engineer, Media planner, Corporate treasurer cues, Verbal cues, and Handouts Education comprehension:    HOME EXERCISE PROGRAM: Access Code: [facility] URL: [email or web address] Date: 06/18/2021 Prepared by: Lyndee Hensen  Exercises Gastroc Stretch on Wall - 2 x daily - 3 reps - 30 hold Heel Raises with Counter Support - 1 x daily - 2 sets - 10 reps   ASSESSMENT:  CLINICAL IMPRESSION:    OBJECTIVE IMPAIRMENTS {opptimpairments:25111}.   ACTIVITY LIMITATIONS {activity limitations:25113}.   PERSONAL FACTORS {Personal factors:25162} are also affecting patient's functional outcome.    REHAB POTENTIAL: {rehabpotential:25112}  CLINICAL DECISION MAKING: {clinical decision making:25114}  EVALUATION COMPLEXITY: {Evaluation complexity:25115}   GOALS: Goals reviewed with patient? Yes  SHORT TERM GOALS: Target date: 07/02/21  ***  Goal status: INITIAL  2.  ***  Goal status: INITIAL  3.  ***  Goal status: INITIAL    LONG TERM GOALS: Target date: 08/13/2021  *** Baseline: *** Goal status: {GOALSTATUS:25110}  2.  *** Baseline: *** Goal status: INITIAL  3.  *** Baseline: *** Goal status: {GOALSTATUS:25110}  4.  *** Baseline: *** Goal status: {GOALSTATUS:25110}  5.  *** Baseline: *** Goal status: {GOALSTATUS:25110}  6.  *** Baseline: *** Goal status:  {GOALSTATUS:25110}   PLAN: PT FREQUENCY: 1-2x/week  PT DURATION: 8 weeks  PLANNED INTERVENTIONS: {rehab planned interventions:25118::"Therapeutic exercises","Therapeutic activity","Neuromuscular re-education","Balance training","Gait training","Patient/Family education","Joint mobilization"}  PLAN FOR NEXT SESSION:    Lyndee Hensen, PT, DPT 12:13 PM  06/18/21

## 2021-06-22 ENCOUNTER — Other Ambulatory Visit: Payer: Self-pay

## 2021-06-22 ENCOUNTER — Encounter: Payer: Self-pay | Admitting: Physical Therapy

## 2021-06-22 ENCOUNTER — Ambulatory Visit: Payer: Federal, State, Local not specified - PPO | Admitting: Physical Therapy

## 2021-06-22 DIAGNOSIS — M25571 Pain in right ankle and joints of right foot: Secondary | ICD-10-CM | POA: Diagnosis not present

## 2021-06-22 DIAGNOSIS — M25572 Pain in left ankle and joints of left foot: Secondary | ICD-10-CM | POA: Diagnosis not present

## 2021-06-22 DIAGNOSIS — R2689 Other abnormalities of gait and mobility: Secondary | ICD-10-CM | POA: Diagnosis not present

## 2021-06-22 NOTE — Therapy (Addendum)
?OUTPATIENT PHYSICAL THERAPY  TREATMENT ? ? ?Patient Name: Jacqueline Sloan ?MRN: [id number] ?DOB:1965-06-25, 56 y.o., female ?Today's Date: 06/22/2021 ? ? PT End of Session - 06/22/21 1118   ? ? Visit Number 2   ? Number of Visits 12   ? Date for PT Re-Evaluation 08/13/21   ? Authorization Type BCBS   ? PT Start Time 0930   ? PT Stop Time 1015   ? PT Time Calculation (min) 45 min   ? Activity Tolerance Patient tolerated treatment well   ? Behavior During Therapy [facility] for tasks assessed/performed   ? ?  ?  ? ?  ? ? ? ?Past Medical History:  ?Diagnosis Date  ? ALLERGIC RHINITIS 12/17/2006  ? ANXIETY 12/17/2006  ? Depression   ? GEN OSTEOARTHROSIS INVOLVING MULTIPLE SITES 12/29/2008  ? Major depression, recurrent, chronic ([place]) 06/03/2017  ? Managed by Noemi Chapel, FNP psych  ? Migraine   ? Migraine without aura or status migrainosus 04/18/2016  ? Preventive with lamictal  ? Myofascial pain syndrome 04/18/2016  ? Nausea with vomiting 02/28/2008  ? Qualifier: Diagnosis of  By: Sherren Mocha MD, Jory Ee   ? Other fatigue 04/18/2016  ? Primary insomnia 04/18/2016  ? Primary osteoarthritis of both feet 04/18/2016  ? Primary osteoarthritis of both hands 04/18/2016  ? Viral URI 02/11/2011  ? ?Past Surgical History:  ?Procedure Laterality Date  ? CESAREAN SECTION  2002  ? DIAGNOSTIC LAPAROSCOPY    ? Diagnosed with endometriosis in approximately 2000  ? TONSILECTOMY, ADENOIDECTOMY, BILATERAL MYRINGOTOMY AND TUBES  2014  ? uterine tumor removed    ? ?Patient Active Problem List  ? Diagnosis Date Noted  ? Major depression, recurrent, chronic ([place]) 06/03/2017  ? Myofascial pain syndrome 04/18/2016  ? Primary osteoarthritis of both hands 04/18/2016  ? Primary osteoarthritis of both feet 04/18/2016  ? Migraine without aura or status migrainosus 04/18/2016  ? Primary insomnia 04/18/2016  ? ALLERGIC RHINITIS 12/17/2006  ? ? ?PCP: Leamon Arnt, MD ? ?REFERRING PROVIDER: Bo Merino, MD ? ?REFERRING DIAG: OA bil feet  ? ?THERAPY DIAG:   ?Pain in left ankle and joints of left foot ? ?Pain in right ankle and joints of right foot ? ?Other abnormalities of gait and mobility ? ?ONSET DATE:  6-8 mo ago ? ?SUBJECTIVE:  ? ?SUBJECTIVE STATEMENT: ? Pt states soreness in heels. ?Wearing mules today, did not find recovery sandals.  ? ? ?PERTINENT HISTORY: ?OA,  ? ?PAIN:  ?Are you having pain? Yes: NPRS scale: 7/10 ?Pain location: Bil central heel and fat pad ?Pain description: Aching, sore ?Aggravating factors: First thing in AM, increased standing/walking ?Relieving factors: Rest ? ?PRECAUTIONS: None ? ?WEIGHT BEARING RESTRICTIONS No ? ?FALLS:  ?Has patient fallen in last 6 months? No, Number of falls: 0 ? ? ?PLOF: Independent ? ?PATIENT GOALS  Decreased pain in feet.  ? ? ?OBJECTIVE:  ? ?COGNITION: ? Overall cognitive status: Within functional limits for tasks assessed   ?  ? ?POSTURE:  ?Foot posture: Seated: normal arch height. Standing: mild navicular drop/pronation on L>R.  ? ? ?PALPATION: ?Pain in central heel and into fat pad , bilaterally,  ? ?LE ROM: ?  3/15//23: Hips: WFL, Knee: WNL, Ankle: WFL, mild limitation for DF on L. ? ?LE MMT: ?  06/20/21:  Hips: 4-/5, Knee: 5/5, Ankle: 4 to 4-/5,  ? ?LOWER EXTREMITY SPECIAL TESTS:  ? ? ?FUNCTIONAL TESTS:  ?Decreased stability with SLS bil, 10-12 sec bil;  ? ?GAIT: ? Decreased and  apprehensive heel strike, decreased heel to to propulsion, decreased push off.  ? ? ?TODAY'S TREATMENT: ? ?06/22/21 ?Therapeutic Exercise: ?Aerobic:  Bike L1 x 6 min;  ?Supine: DF AROM x 20;  ?Seated: ?Standing:  Heel raises, eccentric lowering x 20; SLS 30 sec x 2 bil; Squats x 20; Pre-gait anterior stepping with weight shift, for improved heel strike x 20 bil;  ?Stretches: Gastroc stretch at wall 30 sec x 5 bil;  ?Neuromuscular Re-education: ?Manual Therapy: Fat pad massage, Fat pad taping, k-tape 3 I strips for containment bil;  ?Self Care: Discussed recommendations for current footwear at this time, continuing to wear glycerins  (without orthotic for now), and oofos or recovery sandals for home use.  ? ? ? ? ?06/20/21 ?Therapeutic Exercise: ?Aerobic: ?Supine: ?Seated: ?Standing:  Heel raises, eccentric lowering x 20;  ?Stretches: Gastroc stretch at wall 30 sec x 3 bil;  ?Neuromuscular Re-education: ?Manual Therapy:  ?Self Care: Discussed recommendations for current footwear at this time, continuing to wear glycerins (without orthotic for now), and oofos or recovery sandals for home use.  ? ? ? ?PATIENT EDUCATION:  ?Education details: reviewed HEP, reviewed footwear as above (in self care) ?Person educated: Patient ?Education method: Explanation, Demonstration, Tactile cues, Verbal cues, and Handouts ?Education comprehension:  ? ? ?HOME EXERCISE PROGRAM: ?Access Code: [facility] ? ? ? ?ASSESSMENT: ? ?CLINICAL IMPRESSION: ?Ther ex progressed for mobility and strengthening today. Pt with apprehension for heel strike with gait due to ongoing pain. Improved after education and practice today. Reviewed HEP and importance. Discussed recovery sandal benefits for wearing at/in home. And continued wearing of cushioned sneakers at other times. Fat pad taping done today for trial for decreasing pain.   ? ? ?OBJECTIVE IMPAIRMENTS Abnormal gait, decreased activity tolerance, decreased balance, decreased knowledge of use of DME, decreased mobility, difficulty walking, decreased ROM, decreased strength, increased muscle spasms, impaired flexibility, improper body mechanics, and pain.  ? ?ACTIVITY LIMITATIONS cleaning, community activity, driving, meal prep, laundry, yard work, and shopping.  ? ?PERSONAL FACTORS  none  are also affecting patient's functional outcome.  ? ? ?REHAB POTENTIAL: Good ? ?CLINICAL DECISION MAKING: Stable/uncomplicated ? ?EVALUATION COMPLEXITY: Low ? ? ?GOALS: ?Goals reviewed with patient? Yes ? ?SHORT TERM GOALS: Target date: 07/02/21 ? ?Pt to be independent with initial HEP ? ?Goal status: INITIAL ? ?2.  Pt to report decreased pain in  bil heels to 0-4/10 with standing at least 30 min  ? ?Goal status: INITIAL ? ?3.  Pt to be compliant with footwear at/in home and not go barefoot. ? ?Goal status: INITIAL ? ? ? ?LONG TERM GOALS: Target date: 08/17/2021 ? ?Pt to be independent with final HEP ?Goal status: INITIAL ? ?2.  Pt to be compliant with optimal footwear and/or orthotics, as appropriate for her foot type and dx. ?Goal status: INITIAL ? ?3.  Pt to report decreased pain in ankle/foot/heels to 0-1/10 with standing/walking activity for at least 1 hour.  ?Goal status: INITIAL ? ?4.  Pt to demo improved strength and NMC of bil foot/ankle to be WNL with SL and dynamic activity.  ?Goal status: INITIAL ? ? ? ? ?PLAN: ?PT FREQUENCY: 1-2x/week ? ?PT DURATION: 8 weeks ? ?PLANNED INTERVENTIONS: Therapeutic exercises, Therapeutic activity, Neuromuscular re-education, Balance training, Gait training, Patient/Family education, Joint manipulation, Joint mobilization, Stair training, Orthotic/Fit training, DME instructions, Dry Needling, Electrical stimulation, Spinal manipulation, Spinal mobilization, Cryotherapy, Moist heat, Taping, Vasopneumatic device, Ultrasound, Ionotophoresis '4mg'$ /ml Dexamethasone, and Manual therapy ? ?PLAN FOR NEXT SESSION: Strengthening for foot/ankle,  SL stability, manual for improving DF ROM and fat pad containment, fat pad taping as needed. ? ? ?Lyndee Hensen, PT, DPT ?11:19 AM  06/22/21 ? ? ?

## 2021-06-25 ENCOUNTER — Ambulatory Visit: Payer: Federal, State, Local not specified - PPO | Admitting: Physical Therapy

## 2021-06-25 ENCOUNTER — Encounter: Payer: Self-pay | Admitting: Physical Therapy

## 2021-06-25 DIAGNOSIS — M25572 Pain in left ankle and joints of left foot: Secondary | ICD-10-CM | POA: Diagnosis not present

## 2021-06-25 DIAGNOSIS — R2689 Other abnormalities of gait and mobility: Secondary | ICD-10-CM | POA: Diagnosis not present

## 2021-06-25 DIAGNOSIS — M25571 Pain in right ankle and joints of right foot: Secondary | ICD-10-CM | POA: Diagnosis not present

## 2021-06-25 NOTE — Therapy (Signed)
?OUTPATIENT PHYSICAL THERAPY  TREATMENT ? ? ?Patient Name: Jacqueline Sloan ?MRN: [id number] ?DOB:Jan 29, 1966, 56 y.o., female ?Today's Date: 06/25/2021 ? ? PT End of Session - 06/25/21 1118   ? ? Visit Number 3   ? Number of Visits 12   ? Date for PT Re-Evaluation 08/13/21   ? Authorization Type BCBS   ? PT Start Time [phone]   ? PT Stop Time 1016   ? PT Time Calculation (min) 38 min   ? ?  ?  ? ?  ? ? ? ? ?Past Medical History:  ?Diagnosis Date  ? ALLERGIC RHINITIS 12/17/2006  ? ANXIETY 12/17/2006  ? Depression   ? GEN OSTEOARTHROSIS INVOLVING MULTIPLE SITES 12/29/2008  ? Major depression, recurrent, chronic ([place]) 06/03/2017  ? Managed by Noemi Chapel, FNP psych  ? Migraine   ? Migraine without aura or status migrainosus 04/18/2016  ? Preventive with lamictal  ? Myofascial pain syndrome 04/18/2016  ? Nausea with vomiting 02/28/2008  ? Qualifier: Diagnosis of  By: Sherren Mocha MD, Jory Ee   ? Other fatigue 04/18/2016  ? Primary insomnia 04/18/2016  ? Primary osteoarthritis of both feet 04/18/2016  ? Primary osteoarthritis of both hands 04/18/2016  ? Viral URI 02/11/2011  ? ?Past Surgical History:  ?Procedure Laterality Date  ? CESAREAN SECTION  2002  ? DIAGNOSTIC LAPAROSCOPY    ? Diagnosed with endometriosis in approximately 2000  ? TONSILECTOMY, ADENOIDECTOMY, BILATERAL MYRINGOTOMY AND TUBES  2014  ? uterine tumor removed    ? ?Patient Active Problem List  ? Diagnosis Date Noted  ? Major depression, recurrent, chronic ([place]) 06/03/2017  ? Myofascial pain syndrome 04/18/2016  ? Primary osteoarthritis of both hands 04/18/2016  ? Primary osteoarthritis of both feet 04/18/2016  ? Migraine without aura or status migrainosus 04/18/2016  ? Primary insomnia 04/18/2016  ? ALLERGIC RHINITIS 12/17/2006  ? ? ?PCP: Leamon Arnt, MD ? ?REFERRING PROVIDER: Bo Merino, MD ? ?REFERRING DIAG: OA bil feet  ? ?THERAPY DIAG:  ?Pain in left ankle and joints of left foot ? ?Pain in right ankle and joints of right foot ? ?Other abnormalities  of gait and mobility ? ?ONSET DATE:  6-8 mo ago ? ?SUBJECTIVE:  ? ?SUBJECTIVE STATEMENT: ? Pt states much improved pain today and over the weekend. She is still wearing tape today. Did obtain new oofos sandals and clogs, and wore over the weekend for 10-12 hours of standing, with no pain in heels. She also started back on gabapentin. She was taking previously and still having pain, but was waiting for refill.  ? ? ?PERTINENT HISTORY: ?OA,  ? ?PAIN:  ?Are you having pain? Yes: NPRS scale: 7/10 ?Pain location: Bil central heel and fat pad ?Pain description: Aching, sore ?Aggravating factors: First thing in AM, increased standing/walking ?Relieving factors: Rest ? ?PRECAUTIONS: None ? ?WEIGHT BEARING RESTRICTIONS No ? ?FALLS:  ?Has patient fallen in last 6 months? No, Number of falls: 0 ? ? ?PLOF: Independent ? ?PATIENT GOALS  Decreased pain in feet.  ? ? ?OBJECTIVE:  ? ?COGNITION: ? Overall cognitive status: Within functional limits for tasks assessed   ?  ? ?POSTURE:  ?Foot posture: Seated: normal arch height. Standing: mild navicular drop/pronation on L>R.  ? ? ?PALPATION: ?Pain in central heel and into fat pad , bilaterally,  ? ?LE ROM: ?  3/15//23: Hips: WFL, Knee: WNL, Ankle: WFL, mild limitation for DF on L. ? ?LE MMT: ?  06/20/21:  Hips: 4-/5, Knee: 5/5, Ankle: 4 to 4-/5,  ? ?  LOWER EXTREMITY SPECIAL TESTS:  ? ? ?FUNCTIONAL TESTS:  ?Decreased stability with SLS bil, 10-12 sec bil;  ? ?GAIT: ? Decreased and apprehensive heel strike, decreased heel to to propulsion, decreased push off.  ? ? ?TODAY'S TREATMENT: ?06/25/2021 ?Therapeutic Exercise: ?Aerobic:   ?Supine: DF AROM x 20; S/L hip abd x 10 bil;  ?Seated:  Sit to stand x 10 with education on mechanics.  ?Standing:  Heel raises, eccentric lowering x 25; SLS 30 sec x 2 bil; With head turns x 10 bil;  Sit to stand x 15; ; Pre-gait anterior stepping with weight shift, for improved heel strike x 20 bil;  ?Stretches: Soleus stretch at wall x 12 bil;  ?Neuromuscular  Re-education: ?Manual Therapy: Posterior mobs to inc DF bil;  ?Self Care: Discussed recommendations for current footwear at this time, reviewed new footwear sandals, and discussed continuing to wear oofos and glycerins. ? ? ? ? ?06/22/21 ?Therapeutic Exercise: ?Aerobic:  Bike L1 x 6 min;  ?Supine: DF AROM x 20; ?Seated: ?Standing:  Heel raises, eccentric lowering x 20; SLS 30 sec x 2 bil; Squats x 20; Pre-gait anterior stepping with weight shift, for improved heel strike x 20 bil;  ?Stretches: Gastroc stretch at wall 30 sec x 5 bil;  ?Neuromuscular Re-education: ?Manual Therapy: Fat pad massage, Fat pad taping, k-tape 3 I strips for containment bil;  ?Self Care: Discussed recommendations for current footwear at this time, continuing to wear glycerins (without orthotic for now), and oofos or recovery sandals for home use.  ? ? ? ? ?PATIENT EDUCATION:  ?Education details: reviewed HEP,  ?Person educated: Patient ?Education method: Explanation, Demonstration, Tactile cues, Verbal cues, and Handouts ?Education comprehension:  ? ? ?HOME EXERCISE PROGRAM: ?Access Code: [facility] ? ? ? ?ASSESSMENT: ? ?CLINICAL IMPRESSION: ?Ther ex progressed for foot and hip strengthening today. Pt challenged with hip strength and stability. Cued for improving mechanics with Heel raises. Pt with much decreased pain today with activity and with palpation. Will continue to benefit from strength and stability.  ? ? ?OBJECTIVE IMPAIRMENTS Abnormal gait, decreased activity tolerance, decreased balance, decreased knowledge of use of DME, decreased mobility, difficulty walking, decreased ROM, decreased strength, increased muscle spasms, impaired flexibility, improper body mechanics, and pain.  ? ?ACTIVITY LIMITATIONS cleaning, community activity, driving, meal prep, laundry, yard work, and shopping.  ? ?PERSONAL FACTORS  none  are also affecting patient's functional outcome.  ? ? ?REHAB POTENTIAL: Good ? ?CLINICAL DECISION MAKING:  Stable/uncomplicated ? ?EVALUATION COMPLEXITY: Low ? ? ?GOALS: ?Goals reviewed with patient? Yes ? ?SHORT TERM GOALS: Target date: 07/02/21 ? ?Pt to be independent with initial HEP ? ?Goal status: INITIAL ? ?2.  Pt to report decreased pain in bil heels to 0-4/10 with standing at least 30 min  ? ?Goal status: INITIAL ? ?3.  Pt to be compliant with footwear at/in home and not go barefoot. ? ?Goal status: INITIAL ? ? ? ?LONG TERM GOALS: Target date: 08/20/2021 ? ?Pt to be independent with final HEP ?Goal status: INITIAL ? ?2.  Pt to be compliant with optimal footwear and/or orthotics, as appropriate for her foot type and dx. ?Goal status: INITIAL ? ?3.  Pt to report decreased pain in ankle/foot/heels to 0-1/10 with standing/walking activity for at least 1 hour.  ?Goal status: INITIAL ? ?4.  Pt to demo improved strength and NMC of bil foot/ankle to be WNL with SL and dynamic activity.  ?Goal status: INITIAL ? ? ? ? ?PLAN: ?PT FREQUENCY: 1-2x/week ? ?PT DURATION:  8 weeks ? ?PLANNED INTERVENTIONS: Therapeutic exercises, Therapeutic activity, Neuromuscular re-education, Balance training, Gait training, Patient/Family education, Joint manipulation, Joint mobilization, Stair training, Orthotic/Fit training, DME instructions, Dry Needling, Electrical stimulation, Spinal manipulation, Spinal mobilization, Cryotherapy, Moist heat, Taping, Vasopneumatic device, Ultrasound, Ionotophoresis '4mg'$ /ml Dexamethasone, and Manual therapy ? ?PLAN FOR NEXT SESSION: Strengthening for foot/ankle, SL stability, manual for improving DF ROM and fat pad containment, fat pad taping as needed. ? ? ?Lyndee Hensen, PT, DPT ?11:19 AM  06/25/21 ? ? ?

## 2021-06-27 ENCOUNTER — Encounter: Payer: Federal, State, Local not specified - PPO | Admitting: Physical Therapy

## 2021-06-27 DIAGNOSIS — F3342 Major depressive disorder, recurrent, in full remission: Secondary | ICD-10-CM | POA: Diagnosis not present

## 2021-06-27 DIAGNOSIS — G43109 Migraine with aura, not intractable, without status migrainosus: Secondary | ICD-10-CM | POA: Diagnosis not present

## 2021-06-29 ENCOUNTER — Ambulatory Visit: Payer: Federal, State, Local not specified - PPO | Admitting: Physical Therapy

## 2021-06-29 ENCOUNTER — Encounter: Payer: Self-pay | Admitting: Physical Therapy

## 2021-06-29 DIAGNOSIS — M25571 Pain in right ankle and joints of right foot: Secondary | ICD-10-CM | POA: Diagnosis not present

## 2021-06-29 DIAGNOSIS — M25572 Pain in left ankle and joints of left foot: Secondary | ICD-10-CM

## 2021-06-29 DIAGNOSIS — R2689 Other abnormalities of gait and mobility: Secondary | ICD-10-CM

## 2021-06-29 NOTE — Therapy (Signed)
?OUTPATIENT PHYSICAL THERAPY  TREATMENT ? ? ?Patient Name: Jacqueline Sloan ?MRN: [id number] ?DOB:06/27/1965, 56 y.o., female ?Today's Date: 06/29/2021 ? ? PT End of Session - 06/29/21 1033   ? ? Visit Number 4   ? Number of Visits 12   ? Date for PT Re-Evaluation 08/13/21   ? Authorization Type BCBS   ? PT Start Time [phone]   ? PT Stop Time 1020   ? PT Time Calculation (min) 47 min   ? Activity Tolerance Patient tolerated treatment well   ? Behavior During Therapy [facility] for tasks assessed/performed   ? ?  ?  ? ?  ? ? ? ? ? ?Past Medical History:  ?Diagnosis Date  ? ALLERGIC RHINITIS 12/17/2006  ? ANXIETY 12/17/2006  ? Depression   ? GEN OSTEOARTHROSIS INVOLVING MULTIPLE SITES 12/29/2008  ? Major depression, recurrent, chronic ([place]) 06/03/2017  ? Managed by Noemi Chapel, FNP psych  ? Migraine   ? Migraine without aura or status migrainosus 04/18/2016  ? Preventive with lamictal  ? Myofascial pain syndrome 04/18/2016  ? Nausea with vomiting 02/28/2008  ? Qualifier: Diagnosis of  By: Sherren Mocha MD, Jory Ee   ? Other fatigue 04/18/2016  ? Primary insomnia 04/18/2016  ? Primary osteoarthritis of both feet 04/18/2016  ? Primary osteoarthritis of both hands 04/18/2016  ? Viral URI 02/11/2011  ? ?Past Surgical History:  ?Procedure Laterality Date  ? CESAREAN SECTION  2002  ? DIAGNOSTIC LAPAROSCOPY    ? Diagnosed with endometriosis in approximately 2000  ? TONSILECTOMY, ADENOIDECTOMY, BILATERAL MYRINGOTOMY AND TUBES  2014  ? uterine tumor removed    ? ?Patient Active Problem List  ? Diagnosis Date Noted  ? Major depression, recurrent, chronic ([place]) 06/03/2017  ? Myofascial pain syndrome 04/18/2016  ? Primary osteoarthritis of both hands 04/18/2016  ? Primary osteoarthritis of both feet 04/18/2016  ? Migraine without aura or status migrainosus 04/18/2016  ? Primary insomnia 04/18/2016  ? ALLERGIC RHINITIS 12/17/2006  ? ? ?PCP: Leamon Arnt, MD ? ?REFERRING PROVIDER: Bo Merino, MD ? ?REFERRING DIAG: OA bil feet  ? ?THERAPY  DIAG:  ?Pain in left ankle and joints of left foot ? ?Pain in right ankle and joints of right foot ? ?Other abnormalities of gait and mobility ? ?ONSET DATE:  6-8 mo ago ? ?SUBJECTIVE:  ? ?SUBJECTIVE STATEMENT: ? Pt with increased soreness in bil heels today. Did have several pain free days over last weekend, but has had increased pain in last 2 days. She is wearing oofo shoe. ? ? ?PERTINENT HISTORY: ?OA,  ? ?PAIN:  ?Are you having pain? Yes: NPRS scale: 7/10 ?Pain location: Bil central heel and fat pad ?Pain description: Aching, sore ?Aggravating factors: First thing in AM, increased standing/walking ?Relieving factors: Rest ? ?PRECAUTIONS: None ? ?WEIGHT BEARING RESTRICTIONS No ? ?FALLS:  ?Has patient fallen in last 6 months? No, Number of falls: 0 ? ? ?PLOF: Independent ? ?PATIENT GOALS  Decreased pain in feet.  ? ? ?OBJECTIVE:  ? ?COGNITION: ? Overall cognitive status: Within functional limits for tasks assessed   ?  ? ?POSTURE:  ?Foot posture: Seated: normal arch height. Standing: mild navicular drop/pronation on L>R.  ? ? ?PALPATION: ?Pain in central heel and into fat pad , bilaterally,  ? ?LE ROM: ?  3/15//23: Hips: WFL, Knee: WNL, Ankle: WFL, mild limitation for DF on L. ? ?LE MMT: ?  06/20/21:  Hips: 4-/5, Knee: 5/5, Ankle: 4 to 4-/5,  ? ?LOWER EXTREMITY SPECIAL TESTS:  ? ? ?  FUNCTIONAL TESTS:  ?Decreased stability with SLS bil, 10-12 sec bil;  ? ?GAIT: ? Decreased and apprehensive heel strike, decreased heel to to propulsion, decreased push off.  ? ? ?TODAY'S TREATMENT: ?06/29/21: ?Therapeutic Exercise: ?Aerobic:  Bike L1 x 6 min ?Supine:  ?Seated:   ?Standing:  Heel raises, eccentric lowering x 25;  SLS 30 sec x 2 bil; With UE rotation x 10 bil;  Sit to stand with Blue TB at thighs x 15; Heel raises on step x 15;  ?Stretches:  Gastroc and Soleus stretch at wall x 12 bil;  ?Neuromuscular Re-education: ?Manual Therapy: Posterior mobs to inc DF bil; Contract relax for increasing DF.  ?Self Care: Discussed  recommendations for current footwear at this time, recommended use of sandals (due to decreased pain when wearing for several days prior)  ? ? ? ? ?06/25/2021 ?Therapeutic Exercise: ?Aerobic:   ?Supine: DF AROM x 20; S/L hip abd x 10 bil;  ?Seated:  Sit to stand x 10 with education on mechanics.  ?Standing:  Heel raises, eccentric lowering x 25; SLS 30 sec x 2 bil; With head turns x 10 bil;  Sit to stand x 15; ; Pre-gait anterior stepping with weight shift, for improved heel strike x 20 bil;  ?Stretches: Soleus stretch at wall x 12 bil;  ?Neuromuscular Re-education: ?Manual Therapy: Posterior mobs to inc DF bil;  ?Self Care: Discussed recommendations for current footwear at this time, reviewed new footwear sandals, and discussed continuing to wear oofos and glycerins. ? ? ? ? ?06/22/21 ?Therapeutic Exercise: ?Aerobic:  Bike L1 x 6 min;  ?Supine: DF AROM x 20; ?Seated: ?Standing:  Heel raises, eccentric lowering x 20; SLS 30 sec x 2 bil; Squats x 20; Pre-gait anterior stepping with weight shift, for improved heel strike x 20 bil;  ?Stretches: Gastroc stretch at wall 30 sec x 5 bil;  ?Neuromuscular Re-education: ?Manual Therapy: Fat pad massage, Fat pad taping, k-tape 3 I strips for containment bil;  ?Self Care: Discussed recommendations for current footwear at this time, continuing to wear glycerins (without orthotic for now), and oofos or recovery sandals for home use.  ? ? ? ? ?PATIENT EDUCATION:  ?Education details: reviewed HEP,  ?Person educated: Patient ?Education method: Explanation, Demonstration, Tactile cues, Verbal cues, and Handouts ?Education comprehension:  ? ? ?HOME EXERCISE PROGRAM: ?Access Code: [facility] ? ? ? ?ASSESSMENT: ? ?CLINICAL IMPRESSION: ?Pt with increased soreness today. Reviewed importance of mobility/stretches daily , and use of sandals or most relieving footwear. Continued mobility and strengthening  exercises today as well as taping for improving pain.  ? ? ?OBJECTIVE IMPAIRMENTS Abnormal  gait, decreased activity tolerance, decreased balance, decreased knowledge of use of DME, decreased mobility, difficulty walking, decreased ROM, decreased strength, increased muscle spasms, impaired flexibility, improper body mechanics, and pain.  ? ?ACTIVITY LIMITATIONS cleaning, community activity, driving, meal prep, laundry, yard work, and shopping.  ? ?PERSONAL FACTORS  none  are also affecting patient's functional outcome.  ? ? ?REHAB POTENTIAL: Good ? ?CLINICAL DECISION MAKING: Stable/uncomplicated ? ?EVALUATION COMPLEXITY: Low ? ? ?GOALS: ?Goals reviewed with patient? Yes ? ?SHORT TERM GOALS: Target date: 07/02/21 ? ?Pt to be independent with initial HEP ? ?Goal status: INITIAL ? ?2.  Pt to report decreased pain in bil heels to 0-4/10 with standing at least 30 min  ? ?Goal status: INITIAL ? ?3.  Pt to be compliant with footwear at/in home and not go barefoot. ? ?Goal status: INITIAL ? ? ? ?LONG TERM GOALS: Target date:  08/24/2021 ? ?Pt to be independent with final HEP ?Goal status: INITIAL ? ?2.  Pt to be compliant with optimal footwear and/or orthotics, as appropriate for her foot type and dx. ?Goal status: INITIAL ? ?3.  Pt to report decreased pain in ankle/foot/heels to 0-1/10 with standing/walking activity for at least 1 hour.  ?Goal status: INITIAL ? ?4.  Pt to demo improved strength and NMC of bil foot/ankle to be WNL with SL and dynamic activity.  ?Goal status: INITIAL ? ? ? ? ?PLAN: ?PT FREQUENCY: 1-2x/week ? ?PT DURATION: 8 weeks ? ?PLANNED INTERVENTIONS: Therapeutic exercises, Therapeutic activity, Neuromuscular re-education, Balance training, Gait training, Patient/Family education, Joint manipulation, Joint mobilization, Stair training, Orthotic/Fit training, DME instructions, Dry Needling, Electrical stimulation, Spinal manipulation, Spinal mobilization, Cryotherapy, Moist heat, Taping, Vasopneumatic device, Ultrasound, Ionotophoresis '4mg'$ /ml Dexamethasone, and Manual therapy ? ?PLAN FOR NEXT  SESSION: Strengthening for foot/ankle, SL stability, manual for improving DF ROM and fat pad containment, fat pad taping as needed. ? ? ?Lyndee Hensen, PT, DPT ?10:34 AM  06/29/21 ? ? ?

## 2021-07-02 ENCOUNTER — Ambulatory Visit: Payer: Federal, State, Local not specified - PPO | Admitting: Physical Therapy

## 2021-07-02 ENCOUNTER — Encounter: Payer: Self-pay | Admitting: Physical Therapy

## 2021-07-02 DIAGNOSIS — M25571 Pain in right ankle and joints of right foot: Secondary | ICD-10-CM

## 2021-07-02 DIAGNOSIS — R2689 Other abnormalities of gait and mobility: Secondary | ICD-10-CM | POA: Diagnosis not present

## 2021-07-02 DIAGNOSIS — M25572 Pain in left ankle and joints of left foot: Secondary | ICD-10-CM | POA: Diagnosis not present

## 2021-07-02 NOTE — Therapy (Signed)
?OUTPATIENT PHYSICAL THERAPY  TREATMENT ? ? ?Patient Name: Jacqueline Sloan ?MRN: [id number] ?DOB:1965-06-26, 56 y.o., female ?Today's Date: 07/02/2021 ? ? PT End of Session - 07/02/21 1102   ? ? Visit Number 5   ? Number of Visits 12   ? Date for PT Re-Evaluation 08/13/21   ? Authorization Type BCBS   ? PT Start Time 1103   ? PT Stop Time 1145   ? PT Time Calculation (min) 42 min   ? Activity Tolerance Patient tolerated treatment well   ? Behavior During Therapy [facility] for tasks assessed/performed   ? ?  ?  ? ?  ? ? ? ? ? ?Past Medical History:  ?Diagnosis Date  ? ALLERGIC RHINITIS 12/17/2006  ? ANXIETY 12/17/2006  ? Depression   ? GEN OSTEOARTHROSIS INVOLVING MULTIPLE SITES 12/29/2008  ? Major depression, recurrent, chronic ([place]) 06/03/2017  ? Managed by Noemi Chapel, FNP psych  ? Migraine   ? Migraine without aura or status migrainosus 04/18/2016  ? Preventive with lamictal  ? Myofascial pain syndrome 04/18/2016  ? Nausea with vomiting 02/28/2008  ? Qualifier: Diagnosis of  By: Sherren Mocha MD, Jory Ee   ? Other fatigue 04/18/2016  ? Primary insomnia 04/18/2016  ? Primary osteoarthritis of both feet 04/18/2016  ? Primary osteoarthritis of both hands 04/18/2016  ? Viral URI 02/11/2011  ? ?Past Surgical History:  ?Procedure Laterality Date  ? CESAREAN SECTION  2002  ? DIAGNOSTIC LAPAROSCOPY    ? Diagnosed with endometriosis in approximately 2000  ? TONSILECTOMY, ADENOIDECTOMY, BILATERAL MYRINGOTOMY AND TUBES  2014  ? uterine tumor removed    ? ?Patient Active Problem List  ? Diagnosis Date Noted  ? Major depression, recurrent, chronic ([place]) 06/03/2017  ? Myofascial pain syndrome 04/18/2016  ? Primary osteoarthritis of both hands 04/18/2016  ? Primary osteoarthritis of both feet 04/18/2016  ? Migraine without aura or status migrainosus 04/18/2016  ? Primary insomnia 04/18/2016  ? ALLERGIC RHINITIS 12/17/2006  ? ? ?PCP: Leamon Arnt, MD ? ?REFERRING PROVIDER: Bo Merino, MD ? ?REFERRING DIAG: OA bil feet  ? ?THERAPY  DIAG:  ?Pain in left ankle and joints of left foot ? ?Pain in right ankle and joints of right foot ? ?Other abnormalities of gait and mobility ? ?ONSET DATE:  6-8 mo ago ? ?SUBJECTIVE:  ? ?SUBJECTIVE STATEMENT: ? Pt with decreased pain today from last Friday. Has been wearing tape and sandals since last visit.  ? ? ?PERTINENT HISTORY: ?OA,  ? ?PAIN:  ?Are you having pain? Yes: NPRS scale: 7/10 ?Pain location: Bil central heel and fat pad ?Pain description: Aching, sore ?Aggravating factors: First thing in AM, increased standing/walking ?Relieving factors: Rest ? ?PRECAUTIONS: None ? ?WEIGHT BEARING RESTRICTIONS No ? ?FALLS:  ?Has patient fallen in last 6 months? No, Number of falls: 0 ? ? ?PLOF: Independent ? ?PATIENT GOALS  Decreased pain in feet.  ? ? ?OBJECTIVE:  ? ?COGNITION: ? Overall cognitive status: Within functional limits for tasks assessed   ?  ? ?POSTURE:  ?Foot posture: Seated: normal arch height. Standing: mild navicular drop/pronation on L>R.  ? ? ?PALPATION: ?Pain in central heel and into fat pad , bilaterally,  ? ?LE ROM: ?  3/15//23: Hips: WFL, Knee: WNL, Ankle: WFL, mild limitation for DF on L. ? ?LE MMT: ?  06/20/21:  Hips: 4-/5, Knee: 5/5, Ankle: 4 to 4-/5,  ? ?LOWER EXTREMITY SPECIAL TESTS:  ? ? ?FUNCTIONAL TESTS:  ?Decreased stability with SLS bil, 10-12 sec bil;  ? ?  GAIT: ? Decreased and apprehensive heel strike, decreased heel to to propulsion, decreased push off.  ? ? ?TODAY'S TREATMENT: ? ?Therapeutic Exercise: ?07/02/21: ?Aerobic:   ?Supine: prone DF x 20;  ?Seated:   ?Standing:  Heel raises, eccentric lowering x 25;  SLS with runner lunge x 10 bil; Heel raises with stretch on step x 15; Standing hip rotation into wall x 10 bil;  ?Stretches:  Soleus stretch at wall x 12 bil;  ?Neuromuscular Re-education: ?Manual Therapy: Posterior mobs to inc DF bil;  ?Self Care: Discussed recommendations for current footwear at this time, recommended use of sandals, discussed continued use of taping and  option for tulis heel cups in future if needed. ? ? ?06/29/21: ?Therapeutic Exercise: ?Aerobic:  Bike L1 x 6 min ?Supine:  ?Seated:   ?Standing:  Heel raises, eccentric lowering x 25;  SLS 30 sec x 2 bil; With UE rotation x 10 bil;  Sit to stand with Blue TB at thighs x 15; Heel raises on step x 15;  ?Stretches:  Gastroc and Soleus stretch at wall x 12 bil;  ?Neuromuscular Re-education: ?Manual Therapy: Posterior mobs to inc DF bil; Contract relax for increasing DF.  ?Self Care: Discussed recommendations for current footwear at this time, recommended use of sandals (due to decreased pain when wearing for several days prior)  ? ? ? ? ?06/25/2021 ?Therapeutic Exercise: ?Aerobic:   ?Supine: DF AROM x 20; S/L hip abd x 10 bil;  ?Seated:  Sit to stand x 10 with education on mechanics.  ?Standing:  Heel raises, eccentric lowering x 25; SLS 30 sec x 2 bil; With head turns x 10 bil;  Sit to stand x 15; ; Pre-gait anterior stepping with weight shift, for improved heel strike x 20 bil;  ?Stretches: Soleus stretch at wall x 12 bil;  ?Neuromuscular Re-education: ?Manual Therapy: Posterior mobs to inc DF bil;  ?Self Care: Discussed recommendations for current footwear at this time, reviewed new footwear sandals, and discussed continuing to wear oofos and glycerins. ? ? ? ? ?06/22/21 ?Therapeutic Exercise: ?Aerobic:  Bike L1 x 6 min;  ?Supine: DF AROM x 20; ?Seated: ?Standing:  Heel raises, eccentric lowering x 20; SLS 30 sec x 2 bil; Squats x 20; Pre-gait anterior stepping with weight shift, for improved heel strike x 20 bil;  ?Stretches: Gastroc stretch at wall 30 sec x 5 bil;  ?Neuromuscular Re-education: ?Manual Therapy: Fat pad massage, Fat pad taping, k-tape 3 I strips for containment bil;  ?Self Care: Discussed recommendations for current footwear at this time, continuing to wear glycerins (without orthotic for now), and oofos or recovery sandals for home use.  ? ? ? ? ?PATIENT EDUCATION:  ?Education details: reviewed HEP,   ?Person educated: Patient ?Education method: Explanation, Demonstration, Tactile cues, Verbal cues, and Handouts ?Education comprehension:  ? ? ?HOME EXERCISE PROGRAM: ?Access Code: [facility] ? ? ? ?ASSESSMENT: ? ?CLINICAL IMPRESSION: ?Pt with decreased pain since last visit. Pt with decreased pain wearing taping for fat pad, and wearing recovery sandals. Discussed continued use of sandals for decreased inflammation at this time. Pt doing well with progression of ther ex. May benefit from tulis heel cups in future for regular shoes.  ? ?OBJECTIVE IMPAIRMENTS Abnormal gait, decreased activity tolerance, decreased balance, decreased knowledge of use of DME, decreased mobility, difficulty walking, decreased ROM, decreased strength, increased muscle spasms, impaired flexibility, improper body mechanics, and pain.  ? ?ACTIVITY LIMITATIONS cleaning, community activity, driving, meal prep, laundry, yard work, and shopping.  ? ?  PERSONAL FACTORS  none  are also affecting patient's functional outcome.  ? ? ?REHAB POTENTIAL: Good ? ?CLINICAL DECISION MAKING: Stable/uncomplicated ? ?EVALUATION COMPLEXITY: Low ? ? ?GOALS: ?Goals reviewed with patient? Yes ? ?SHORT TERM GOALS: Target date: 07/02/21 ? ?Pt to be independent with initial HEP ? ?Goal status: INITIAL ? ?2.  Pt to report decreased pain in bil heels to 0-4/10 with standing at least 30 min  ? ?Goal status: INITIAL ? ?3.  Pt to be compliant with footwear at/in home and not go barefoot. ? ?Goal status: INITIAL ? ? ? ?LONG TERM GOALS: Target date: 08/27/2021 ? ?Pt to be independent with final HEP ?Goal status: INITIAL ? ?2.  Pt to be compliant with optimal footwear and/or orthotics, as appropriate for her foot type and dx. ?Goal status: INITIAL ? ?3.  Pt to report decreased pain in ankle/foot/heels to 0-1/10 with standing/walking activity for at least 1 hour.  ?Goal status: INITIAL ? ?4.  Pt to demo improved strength and NMC of bil foot/ankle to be WNL with SL and dynamic  activity.  ?Goal status: INITIAL ? ? ? ? ?PLAN: ?PT FREQUENCY: 1-2x/week ? ?PT DURATION: 8 weeks ? ?PLANNED INTERVENTIONS: Therapeutic exercises, Therapeutic activity, Neuromuscular re-education, Balance train

## 2021-07-06 ENCOUNTER — Encounter: Payer: Self-pay | Admitting: Physical Therapy

## 2021-07-06 ENCOUNTER — Ambulatory Visit: Payer: Federal, State, Local not specified - PPO | Admitting: Physical Therapy

## 2021-07-06 DIAGNOSIS — M25571 Pain in right ankle and joints of right foot: Secondary | ICD-10-CM | POA: Diagnosis not present

## 2021-07-06 DIAGNOSIS — M25572 Pain in left ankle and joints of left foot: Secondary | ICD-10-CM | POA: Diagnosis not present

## 2021-07-06 DIAGNOSIS — R2689 Other abnormalities of gait and mobility: Secondary | ICD-10-CM | POA: Diagnosis not present

## 2021-07-06 NOTE — Therapy (Signed)
?OUTPATIENT PHYSICAL THERAPY  TREATMENT ? ? ?Patient Name: Jacqueline Sloan ?MRN: [id number] ?DOB:12/25/1965, 56 y.o., female ?Today's Date: 07/06/2021 ? ? PT End of Session - 07/06/21 1330   ? ? Visit Number 6   ? Number of Visits 12   ? Date for PT Re-Evaluation 08/13/21   ? Authorization Type BCBS   ? PT Start Time 1100   ? PT Stop Time 1601   ? PT Time Calculation (min) 38 min   ? Activity Tolerance Patient tolerated treatment well   ? Behavior During Therapy [facility] for tasks assessed/performed   ? ?  ?  ? ?  ? ? ? ? ? ? ?Past Medical History:  ?Diagnosis Date  ? ALLERGIC RHINITIS 12/17/2006  ? ANXIETY 12/17/2006  ? Depression   ? GEN OSTEOARTHROSIS INVOLVING MULTIPLE SITES 12/29/2008  ? Major depression, recurrent, chronic ([place]) 06/03/2017  ? Managed by Noemi Chapel, FNP psych  ? Migraine   ? Migraine without aura or status migrainosus 04/18/2016  ? Preventive with lamictal  ? Myofascial pain syndrome 04/18/2016  ? Nausea with vomiting 02/28/2008  ? Qualifier: Diagnosis of  By: Sherren Mocha MD, Jory Ee   ? Other fatigue 04/18/2016  ? Primary insomnia 04/18/2016  ? Primary osteoarthritis of both feet 04/18/2016  ? Primary osteoarthritis of both hands 04/18/2016  ? Viral URI 02/11/2011  ? ?Past Surgical History:  ?Procedure Laterality Date  ? CESAREAN SECTION  2002  ? DIAGNOSTIC LAPAROSCOPY    ? Diagnosed with endometriosis in approximately 2000  ? TONSILECTOMY, ADENOIDECTOMY, BILATERAL MYRINGOTOMY AND TUBES  2014  ? uterine tumor removed    ? ?Patient Active Problem List  ? Diagnosis Date Noted  ? Major depression, recurrent, chronic ([place]) 06/03/2017  ? Myofascial pain syndrome 04/18/2016  ? Primary osteoarthritis of both hands 04/18/2016  ? Primary osteoarthritis of both feet 04/18/2016  ? Migraine without aura or status migrainosus 04/18/2016  ? Primary insomnia 04/18/2016  ? ALLERGIC RHINITIS 12/17/2006  ? ? ?PCP: Leamon Arnt, MD ? ?REFERRING PROVIDER: Bo Merino, MD ? ?REFERRING DIAG: OA bil feet  ? ?THERAPY  DIAG:  ?Pain in left ankle and joints of left foot ? ?Pain in right ankle and joints of right foot ? ?Other abnormalities of gait and mobility ? ?ONSET DATE:  6-8 mo ago ? ?SUBJECTIVE:  ? ?SUBJECTIVE STATEMENT: ? Pt with decreased pain. She has continued to wear sandals for majority of the time. She did purchase heel cups as well.  ? ? ?PERTINENT HISTORY: ?OA,  ? ?PAIN:  ?Are you having pain? Yes: NPRS scale: 7/10 ?Pain location: Bil central heel and fat pad ?Pain description: Aching, sore ?Aggravating factors: First thing in AM, increased standing/walking ?Relieving factors: Rest ? ?PRECAUTIONS: None ? ?WEIGHT BEARING RESTRICTIONS No ? ?FALLS:  ?Has patient fallen in last 6 months? No, Number of falls: 0 ? ? ?PLOF: Independent ? ?PATIENT GOALS  Decreased pain in feet.  ? ? ?OBJECTIVE:  ? ?COGNITION: ? Overall cognitive status: Within functional limits for tasks assessed   ?  ? ?POSTURE:  ?Foot posture: Seated: normal arch height. Standing: mild navicular drop/pronation on L>R.  ? ? ?PALPATION: ?Pain in central heel and into fat pad , bilaterally,  ? ?LE ROM: ?  3/15//23: Hips: WFL, Knee: WNL, Ankle: WFL, mild limitation for DF on L. ? ?LE MMT: ?  06/20/21:  Hips: 4-/5, Knee: 5/5, Ankle: 4 to 4-/5,  ? ?LOWER EXTREMITY SPECIAL TESTS:  ? ? ?FUNCTIONAL TESTS:  ?Decreased stability  with SLS bil, 10-12 sec bil;  ? ?GAIT: ? Decreased and apprehensive heel strike, decreased heel to to propulsion, decreased push off.  ? ? ?TODAY'S TREATMENT: ? ?07/06/21: ?Aerobic:   ?Supine: prone DF x 20; GTB INV x 20 bil;  ?Seated:   ?Standing:  Heel raises, eccentric lowering x 25;  SLS with UE rotation 3x5 bil;  Heel raises with stretch on step x 15; Fwd and bwd march/walk for hip and LE stability 10 ft x 4 ea; Squats to mat table x 20;  ?Stretches:   ?Neuromuscular Re-education: ?Manual Therapy: Posterior mobs to inc DF bil; Manual stretching for DF ;  ?Self Care:  ? ? ?Therapeutic Exercise: ?07/02/21: ?Aerobic:   ?Supine: prone DF x 20;   ?Seated:   ?Standing:  Heel raises, eccentric lowering x 25;  SLS with runner lunge x 10 bil; Heel raises with stretch on step x 15; Standing hip rotation into wall x 10 bil;  ?Stretches:  Soleus stretch at wall x 12 bil;  ?Neuromuscular Re-education: ?Manual Therapy: Posterior mobs to inc DF bil;  ?Self Care: Discussed recommendations for current footwear at this time, recommended use of sandals, discussed continued use of taping and option for tulis heel cups in future if needed. ? ? ?06/29/21: ?Therapeutic Exercise: ?Aerobic:  Bike L1 x 6 min ?Supine:  ?Seated:   ?Standing:  Heel raises, eccentric lowering x 25;  SLS 30 sec x 2 bil; With UE rotation x 10 bil;  Sit to stand with Blue TB at thighs x 15; Heel raises on step x 15;  ?Stretches:  Gastroc and Soleus stretch at wall x 12 bil;  ?Neuromuscular Re-education: ?Manual Therapy: Posterior mobs to inc DF bil; Contract relax for increasing DF.  ?Self Care: Discussed recommendations for current footwear at this time, recommended use of sandals (due to decreased pain when wearing for several days prior)  ? ? ? ?PATIENT EDUCATION:  ?Education details: reviewed HEP,  ?Person educated: Patient ?Education method: Explanation, Demonstration, Tactile cues, Verbal cues, and Handouts ?Education comprehension:  ? ? ?HOME EXERCISE PROGRAM: ?Access Code: [facility] ? ? ? ?ASSESSMENT: ? ?CLINICAL IMPRESSION: ?Pt with decreased pain levels. Discussed continued use of sandals and heel cups if in regular shoes. Pt with noted hip and foot instability and weakness-challenged with exercises today, will benefit from continued progression of this.   ? ?OBJECTIVE IMPAIRMENTS Abnormal gait, decreased activity tolerance, decreased balance, decreased knowledge of use of DME, decreased mobility, difficulty walking, decreased ROM, decreased strength, increased muscle spasms, impaired flexibility, improper body mechanics, and pain.  ? ?ACTIVITY LIMITATIONS cleaning, community activity,  driving, meal prep, laundry, yard work, and shopping.  ? ?PERSONAL FACTORS  none  are also affecting patient's functional outcome.  ? ? ?REHAB POTENTIAL: Good ? ?CLINICAL DECISION MAKING: Stable/uncomplicated ? ?EVALUATION COMPLEXITY: Low ? ? ?GOALS: ?Goals reviewed with patient? Yes ? ?SHORT TERM GOALS: Target date: 07/02/21 ? ?Pt to be independent with initial HEP ? ?Goal status: INITIAL ? ?2.  Pt to report decreased pain in bil heels to 0-4/10 with standing at least 30 min  ? ?Goal status: INITIAL ? ?3.  Pt to be compliant with footwear at/in home and not go barefoot. ? ?Goal status: INITIAL ? ? ? ?LONG TERM GOALS: Target date: 08/31/2021 ? ?Pt to be independent with final HEP ?Goal status: INITIAL ? ?2.  Pt to be compliant with optimal footwear and/or orthotics, as appropriate for her foot type and dx. ?Goal status: INITIAL ? ?3.  Pt to  report decreased pain in ankle/foot/heels to 0-1/10 with standing/walking activity for at least 1 hour.  ?Goal status: INITIAL ? ?4.  Pt to demo improved strength and NMC of bil foot/ankle to be WNL with SL and dynamic activity.  ?Goal status: INITIAL ? ? ? ? ?PLAN: ?PT FREQUENCY: 1-2x/week ? ?PT DURATION: 8 weeks ? ?PLANNED INTERVENTIONS: Therapeutic exercises, Therapeutic activity, Neuromuscular re-education, Balance training, Gait training, Patient/Family education, Joint manipulation, Joint mobilization, Stair training, Orthotic/Fit training, DME instructions, Dry Needling, Electrical stimulation, Spinal manipulation, Spinal mobilization, Cryotherapy, Moist heat, Taping, Vasopneumatic device, Ultrasound, Ionotophoresis '4mg'$ /ml Dexamethasone, and Manual therapy ? ?PLAN FOR NEXT SESSION: Strengthening for foot/ankle, SL stability, manual for improving DF ROM and fat pad containment, fat pad taping as needed. ? ? ?Lyndee Hensen, PT, DPT ?1:30 PM  07/06/21 ? ? ?

## 2021-07-09 ENCOUNTER — Encounter: Payer: Federal, State, Local not specified - PPO | Admitting: Physical Therapy

## 2021-07-16 ENCOUNTER — Ambulatory Visit: Payer: Federal, State, Local not specified - PPO | Admitting: Physical Therapy

## 2021-07-16 ENCOUNTER — Encounter: Payer: Self-pay | Admitting: Physical Therapy

## 2021-07-16 DIAGNOSIS — R2689 Other abnormalities of gait and mobility: Secondary | ICD-10-CM | POA: Diagnosis not present

## 2021-07-16 DIAGNOSIS — M25572 Pain in left ankle and joints of left foot: Secondary | ICD-10-CM | POA: Diagnosis not present

## 2021-07-16 DIAGNOSIS — M25571 Pain in right ankle and joints of right foot: Secondary | ICD-10-CM

## 2021-07-16 NOTE — Therapy (Signed)
?OUTPATIENT PHYSICAL THERAPY  TREATMENT ? ? ?Patient Name: Jacqueline Sloan ?MRN: [id number] ?DOB:1965/10/09, 56 y.o., female ?Today's Date: 07/16/2021 ? ? PT End of Session - 07/16/21 1149   ? ? Visit Number 7   ? Number of Visits 12   ? Date for PT Re-Evaluation 08/13/21   ? Authorization Type BCBS   ? PT Start Time 1105   ? PT Stop Time 1147   ? PT Time Calculation (min) 42 min   ? Activity Tolerance Patient tolerated treatment well   ? Behavior During Therapy [facility] for tasks assessed/performed   ? ?  ?  ? ?  ? ? ? ? ? ? ? ?Past Medical History:  ?Diagnosis Date  ? ALLERGIC RHINITIS 12/17/2006  ? ANXIETY 12/17/2006  ? Depression   ? GEN OSTEOARTHROSIS INVOLVING MULTIPLE SITES 12/29/2008  ? Major depression, recurrent, chronic ([place]) 06/03/2017  ? Managed by Noemi Chapel, FNP psych  ? Migraine   ? Migraine without aura or status migrainosus 04/18/2016  ? Preventive with lamictal  ? Myofascial pain syndrome 04/18/2016  ? Nausea with vomiting 02/28/2008  ? Qualifier: Diagnosis of  By: Sherren Mocha MD, Jory Ee   ? Other fatigue 04/18/2016  ? Primary insomnia 04/18/2016  ? Primary osteoarthritis of both feet 04/18/2016  ? Primary osteoarthritis of both hands 04/18/2016  ? Viral URI 02/11/2011  ? ?Past Surgical History:  ?Procedure Laterality Date  ? CESAREAN SECTION  2002  ? DIAGNOSTIC LAPAROSCOPY    ? Diagnosed with endometriosis in approximately 2000  ? TONSILECTOMY, ADENOIDECTOMY, BILATERAL MYRINGOTOMY AND TUBES  2014  ? uterine tumor removed    ? ?Patient Active Problem List  ? Diagnosis Date Noted  ? Major depression, recurrent, chronic ([place]) 06/03/2017  ? Myofascial pain syndrome 04/18/2016  ? Primary osteoarthritis of both hands 04/18/2016  ? Primary osteoarthritis of both feet 04/18/2016  ? Migraine without aura or status migrainosus 04/18/2016  ? Primary insomnia 04/18/2016  ? ALLERGIC RHINITIS 12/17/2006  ? ? ?PCP: Leamon Arnt, MD ? ?REFERRING PROVIDER: Bo Merino, MD ? ?REFERRING DIAG: OA bil feet   ? ?THERAPY DIAG:  ?Pain in left ankle and joints of left foot ? ?Pain in right ankle and joints of right foot ? ?Other abnormalities of gait and mobility ? ?ONSET DATE:  6-8 mo ago ? ?SUBJECTIVE:  ? ?SUBJECTIVE STATEMENT: ? Pt with increased pain/more constant pain this past week. She admits to not doing much of HEP or stretching.  ? ? ?PERTINENT HISTORY: ?OA,  ? ?PAIN:  ?Are you having pain? Yes: NPRS scale: 7/10 ?Pain location: Bil central heel and fat pad ?Pain description: Aching, sore ?Aggravating factors: First thing in AM, increased standing/walking ?Relieving factors: Rest ? ?PRECAUTIONS: None ? ?WEIGHT BEARING RESTRICTIONS No ? ?FALLS:  ?Has patient fallen in last 6 months? No, Number of falls: 0 ? ? ?PLOF: Independent ? ?PATIENT GOALS  Decreased pain in feet.  ? ? ?OBJECTIVE:  ? ?COGNITION: ? Overall cognitive status: Within functional limits for tasks assessed   ?  ? ?POSTURE:  ?Foot posture: Seated: normal arch height. Standing: mild navicular drop/pronation on L>R.  ? ? ?PALPATION: ?Pain in central heel and into fat pad , bilaterally,  ? ?LE ROM: ?  3/15//23: Hips: WFL, Knee: WNL, Ankle: WFL, mild limitation for DF on L. ? ?LE MMT: ?  06/20/21:  Hips: 4-/5, Knee: 5/5, Ankle: 4 to 4-/5,  ? ?LOWER EXTREMITY SPECIAL TESTS:  ? ? ?FUNCTIONAL TESTS:  ?Decreased stability with SLS  bil, 10-12 sec bil;  ? ?GAIT: ? Decreased and apprehensive heel strike, decreased heel to to propulsion, decreased push off.  ? ? ?TODAY'S TREATMENT: ? ?07/16/21: ?Aerobic:   ?Supine: hooklying: DF lifts x 15 bil;  ?Seated:   ?Standing:   SLS with UE rotation 3x5 bil;  Heel raises with stretch on step x 15; Squats to mat table x 20; AirEx: Marching x 20;  ?Stretches:  Gastroc and soleus at the wall x3 min bil;  ?Neuromuscular Re-education: ?Manual Therapy: Ankle: Posterior mobs to inc DF bil;  Manual stretching for DF ; Fat pad massage bil;  ?Self Care:  ? ? ?07/06/21: ?Aerobic:   ?Supine: prone DF x 20; GTB INV x 20 bil;  ?Seated:    ?Standing:  Heel raises, eccentric lowering x 25;  SLS with UE rotation 3x5 bil;  Heel raises with stretch on step x 15; Fwd and bwd march/walk for hip and LE stability 10 ft x 4 ea; Squats to mat table x 20;  ?Stretches:   ?Neuromuscular Re-education: ?Manual Therapy: Posterior mobs to inc DF bil; Manual stretching for DF ;  ?Self Care:  ? ? ?PATIENT EDUCATION:  ?Education details: reviewed HEP,  ?Person educated: Patient ?Education method: Explanation, Demonstration, Tactile cues, Verbal cues, and Handouts ?Education comprehension:  ? ? ?HOME EXERCISE PROGRAM: ?Access Code: [facility] ? ? ?ASSESSMENT: ? ?CLINICAL IMPRESSION: ?Pt with variable pain levels over past few weeks. Today is still having increased pain. She does have limitation for DF ROM and control of DF with activity. She is very challenged with SLS and stability, with noted instability from hip and knee as well.Discussed doing mobility exercises 2x/day and being compliant with HEP to see if pain decreases.  Pt to benefit from continued care.  ? ? ?OBJECTIVE IMPAIRMENTS Abnormal gait, decreased activity tolerance, decreased balance, decreased knowledge of use of DME, decreased mobility, difficulty walking, decreased ROM, decreased strength, increased muscle spasms, impaired flexibility, improper body mechanics, and pain.  ? ?ACTIVITY LIMITATIONS cleaning, community activity, driving, meal prep, laundry, yard work, and shopping.  ? ?PERSONAL FACTORS  none  are also affecting patient's functional outcome.  ? ? ?REHAB POTENTIAL: Good ? ?CLINICAL DECISION MAKING: Stable/uncomplicated ? ?EVALUATION COMPLEXITY: Low ? ? ?GOALS: ?Goals reviewed with patient? Yes ? ?SHORT TERM GOALS: Target date: 07/02/21 ? ?Pt to be independent with initial HEP ? ?Goal status: INITIAL ? ?2.  Pt to report decreased pain in bil heels to 0-4/10 with standing at least 30 min  ? ?Goal status: INITIAL ? ?3.  Pt to be compliant with footwear at/in home and not go barefoot. ? ?Goal  status: INITIAL ? ? ? ?LONG TERM GOALS: Target date: 09/10/2021 ? ?Pt to be independent with final HEP ?Goal status: INITIAL ? ?2.  Pt to be compliant with optimal footwear and/or orthotics, as appropriate for her foot type and dx. ?Goal status: INITIAL ? ?3.  Pt to report decreased pain in ankle/foot/heels to 0-1/10 with standing/walking activity for at least 1 hour.  ?Goal status: INITIAL ? ?4.  Pt to demo improved strength and NMC of bil foot/ankle to be WNL with SL and dynamic activity.  ?Goal status: INITIAL ? ? ? ? ?PLAN: ?PT FREQUENCY: 1-2x/week ? ?PT DURATION: 8 weeks ? ?PLANNED INTERVENTIONS: Therapeutic exercises, Therapeutic activity, Neuromuscular re-education, Balance training, Gait training, Patient/Family education, Joint manipulation, Joint mobilization, Stair training, Orthotic/Fit training, DME instructions, Dry Needling, Electrical stimulation, Spinal manipulation, Spinal mobilization, Cryotherapy, Moist heat, Taping, Vasopneumatic device, Ultrasound, Ionotophoresis '4mg'$ /ml Dexamethasone,  and Manual therapy ? ?PLAN FOR NEXT SESSION: Strengthening for foot/ankle, SL stability, manual for improving DF ROM and fat pad containment, fat pad taping as needed. ? ? ?Lyndee Hensen, PT, DPT ?11:50 AM  07/16/21 ? ? ?

## 2021-07-17 DIAGNOSIS — L237 Allergic contact dermatitis due to plants, except food: Secondary | ICD-10-CM | POA: Diagnosis not present

## 2021-07-20 ENCOUNTER — Ambulatory Visit: Payer: Federal, State, Local not specified - PPO | Admitting: Physical Therapy

## 2021-07-20 DIAGNOSIS — M25572 Pain in left ankle and joints of left foot: Secondary | ICD-10-CM | POA: Diagnosis not present

## 2021-07-20 DIAGNOSIS — R2689 Other abnormalities of gait and mobility: Secondary | ICD-10-CM | POA: Diagnosis not present

## 2021-07-20 DIAGNOSIS — M25571 Pain in right ankle and joints of right foot: Secondary | ICD-10-CM | POA: Diagnosis not present

## 2021-07-20 NOTE — Therapy (Signed)
?OUTPATIENT PHYSICAL THERAPY  TREATMENT ? ? ?Patient Name: Jacqueline Sloan ?MRN: [id number] ?DOB:March 02, 1966, 56 y.o., female ?Today's Date: 07/23/2021 ? ? PT End of Session - 07/23/21 0853   ? ? Visit Number 8   ? Number of Visits 12   ? Date for PT Re-Evaluation 08/13/21   ? Authorization Type BCBS   ? PT Start Time 1100   ? PT Stop Time 1145   ? PT Time Calculation (min) 45 min   ? Activity Tolerance Patient tolerated treatment well   ? Behavior During Therapy [facility] for tasks assessed/performed   ? ?  ?  ? ?  ? ? ? ? ? ? ? ? ?Past Medical History:  ?Diagnosis Date  ? ALLERGIC RHINITIS 12/17/2006  ? ANXIETY 12/17/2006  ? Depression   ? GEN OSTEOARTHROSIS INVOLVING MULTIPLE SITES 12/29/2008  ? Major depression, recurrent, chronic ([place]) 06/03/2017  ? Managed by Noemi Chapel, FNP psych  ? Migraine   ? Migraine without aura or status migrainosus 04/18/2016  ? Preventive with lamictal  ? Myofascial pain syndrome 04/18/2016  ? Nausea with vomiting 02/28/2008  ? Qualifier: Diagnosis of  By: Sherren Mocha MD, Jory Ee   ? Other fatigue 04/18/2016  ? Primary insomnia 04/18/2016  ? Primary osteoarthritis of both feet 04/18/2016  ? Primary osteoarthritis of both hands 04/18/2016  ? Viral URI 02/11/2011  ? ?Past Surgical History:  ?Procedure Laterality Date  ? CESAREAN SECTION  2002  ? DIAGNOSTIC LAPAROSCOPY    ? Diagnosed with endometriosis in approximately 2000  ? TONSILECTOMY, ADENOIDECTOMY, BILATERAL MYRINGOTOMY AND TUBES  2014  ? uterine tumor removed    ? ?Patient Active Problem List  ? Diagnosis Date Noted  ? Major depression, recurrent, chronic ([place]) 06/03/2017  ? Myofascial pain syndrome 04/18/2016  ? Primary osteoarthritis of both hands 04/18/2016  ? Primary osteoarthritis of both feet 04/18/2016  ? Migraine without aura or status migrainosus 04/18/2016  ? Primary insomnia 04/18/2016  ? ALLERGIC RHINITIS 12/17/2006  ? ? ?PCP: Leamon Arnt, MD ? ?REFERRING PROVIDER: Bo Merino, MD ? ?REFERRING DIAG: OA bil feet   ? ?THERAPY DIAG:  ?Pain in left ankle and joints of left foot ? ?Pain in right ankle and joints of right foot ? ?Other abnormalities of gait and mobility ? ?ONSET DATE:  6-8 mo ago ? ?SUBJECTIVE:  ? ?SUBJECTIVE STATEMENT: ? Pt with less pain this week. Has been wearing sandals, and has been doing more stretching/ HEP. Also is taking prednisone for poison oak.  ? ?PERTINENT HISTORY: ?OA,  ? ?PAIN:  ?Are you having pain? Yes: NPRS scale: 7/10 ?Pain location: Bil central heel and fat pad ?Pain description: Aching, sore ?Aggravating factors: First thing in AM, increased standing/walking ?Relieving factors: Rest ? ?PRECAUTIONS: None ? ?WEIGHT BEARING RESTRICTIONS No ? ?FALLS:  ?Has patient fallen in last 6 months? No, Number of falls: 0 ? ? ?PLOF: Independent ? ?PATIENT GOALS  Decreased pain in feet.  ? ? ?OBJECTIVE:  ? ?COGNITION: ? Overall cognitive status: Within functional limits for tasks assessed   ?  ? ?POSTURE:  ?Foot posture: Seated: normal arch height. Standing: mild navicular drop/pronation on L>R.  ? ? ?PALPATION: ?Pain in central heel and into fat pad , bilaterally,  ? ?LE ROM: ?  3/15//23: Hips: WFL, Knee: WNL, Ankle: WFL, mild limitation for DF on L. ? ?LE MMT: ?  06/20/21:  Hips: 4-/5, Knee: 5/5, Ankle: 4 to 4-/5,  ? ?LOWER EXTREMITY SPECIAL TESTS:  ? ? ?FUNCTIONAL TESTS:  ?  Decreased stability with SLS bil, 10-12 sec bil;  ? ?GAIT: ? Decreased and apprehensive heel strike, decreased heel to to propulsion, decreased push off.  ? ? ?TODAY'S TREATMENT: ?07/20/21: ?Aerobic:   ?Supine: hooklying: DF lifts x 15  ?Seated:   ?Standing:  SLS with UE rotation 3x5 bil;  Heel raises with stretch on step x 15; Squats x 20; AirEx: SLS ;  HR x 20;  SL reach to chair 2x5 bil/knee bent;  ?Stretches:  Gastroc and soleus at the wall x3 min bil;  ?Neuromuscular Re-education: ?Manual Therapy: Ankle: Posterior mobs to inc DF bil;  Manual stretching for DF ; Fat pad massage bil;  ?Self Care:  ? ? ?07/16/21: ?Aerobic:   ?Supine:  hooklying: DF lifts x 15 bil;  ?Seated:   ?Standing:   SLS with UE rotation 3x5 bil;  Heel raises with stretch on step x 15; Squats to mat table x 20; AirEx: Marching x 20;  ?Stretches:  Gastroc and soleus at the wall x3 min bil;  ?Neuromuscular Re-education: ?Manual Therapy: Ankle: Posterior mobs to inc DF bil;  Manual stretching for DF ; Fat pad massage bil;  ?Self Care:  ? ? ?07/06/21: ?Aerobic:   ?Supine: prone DF x 20; GTB INV x 20 bil;  ?Seated:   ?Standing:  Heel raises, eccentric lowering x 25;  SLS with UE rotation 3x5 bil;  Heel raises with stretch on step x 15; Fwd and bwd march/walk for hip and LE stability 10 ft x 4 ea; Squats to mat table x 20;  ?Stretches:   ?Neuromuscular Re-education: ?Manual Therapy: Posterior mobs to inc DF bil; Manual stretching for DF ;  ?Self Care:  ? ? ?PATIENT EDUCATION:  ?Education details: reviewed HEP,  ?Person educated: Patient ?Education method: Explanation, Demonstration, Tactile cues, Verbal cues, and Handouts ?Education comprehension:  ? ? ?HOME EXERCISE PROGRAM: ?Access Code: [facility] ? ? ?ASSESSMENT: ? ?CLINICAL IMPRESSION: ?Pt with variable pain levels over past few weeks. This week is better, but she is also taking prednisone. We discussed continuing HEP for mobility and stability. She continues to have noted instability with SL activity, at ankle, knee and hip, and will benefit from continued care for improving stability and pain.  ? ? ?OBJECTIVE IMPAIRMENTS Abnormal gait, decreased activity tolerance, decreased balance, decreased knowledge of use of DME, decreased mobility, difficulty walking, decreased ROM, decreased strength, increased muscle spasms, impaired flexibility, improper body mechanics, and pain.  ? ?ACTIVITY LIMITATIONS cleaning, community activity, driving, meal prep, laundry, yard work, and shopping.  ? ?PERSONAL FACTORS  none  are also affecting patient's functional outcome.  ? ? ?REHAB POTENTIAL: Good ? ?CLINICAL DECISION MAKING:  Stable/uncomplicated ? ?EVALUATION COMPLEXITY: Low ? ? ?GOALS: ?Goals reviewed with patient? Yes ? ?SHORT TERM GOALS: Target date: 07/02/21 ? ?Pt to be independent with initial HEP ? ?Goal status: INITIAL ? ?2.  Pt to report decreased pain in bil heels to 0-4/10 with standing at least 30 min  ? ?Goal status: INITIAL ? ?3.  Pt to be compliant with footwear at/in home and not go barefoot. ? ?Goal status: INITIAL ? ? ? ?LONG TERM GOALS: Target date: 09/17/2021 ? ?Pt to be independent with final HEP ?Goal status: INITIAL ? ?2.  Pt to be compliant with optimal footwear and/or orthotics, as appropriate for her foot type and dx. ?Goal status: INITIAL ? ?3.  Pt to report decreased pain in ankle/foot/heels to 0-1/10 with standing/walking activity for at least 1 hour.  ?Goal status: INITIAL ? ?4.  Pt to demo improved strength and NMC of bil foot/ankle to be WNL with SL and dynamic activity.  ?Goal status: INITIAL ? ? ? ? ?PLAN: ?PT FREQUENCY: 1-2x/week ? ?PT DURATION: 8 weeks ? ?PLANNED INTERVENTIONS: Therapeutic exercises, Therapeutic activity, Neuromuscular re-education, Balance training, Gait training, Patient/Family education, Joint manipulation, Joint mobilization, Stair training, Orthotic/Fit training, DME instructions, Dry Needling, Electrical stimulation, Spinal manipulation, Spinal mobilization, Cryotherapy, Moist heat, Taping, Vasopneumatic device, Ultrasound, Ionotophoresis '4mg'$ /ml Dexamethasone, and Manual therapy ? ?PLAN FOR NEXT SESSION: Strengthening for foot/ankle, SL stability, manual for improving DF ROM and fat pad containment, fat pad taping as needed. ? ? ?Lyndee Hensen, PT, DPT ?8:54 AM  07/23/21 ? ? ?

## 2021-07-23 ENCOUNTER — Encounter: Payer: Self-pay | Admitting: Physical Therapy

## 2021-07-23 ENCOUNTER — Ambulatory Visit: Payer: Federal, State, Local not specified - PPO | Admitting: Physical Therapy

## 2021-07-23 DIAGNOSIS — M25571 Pain in right ankle and joints of right foot: Secondary | ICD-10-CM | POA: Diagnosis not present

## 2021-07-23 DIAGNOSIS — R2689 Other abnormalities of gait and mobility: Secondary | ICD-10-CM | POA: Diagnosis not present

## 2021-07-23 DIAGNOSIS — M25572 Pain in left ankle and joints of left foot: Secondary | ICD-10-CM | POA: Diagnosis not present

## 2021-07-23 NOTE — Therapy (Addendum)
?OUTPATIENT PHYSICAL THERAPY  TREATMENT ? ? ?Patient Name: Jacqueline Sloan ?MRN: [id number] ?DOB:01/06/1966, 56 y.o., female ?Today's Date: 07/23/2021 ? ? PT End of Session - 07/23/21 1342   ? ? Visit Number 9   ? Number of Visits 12   ? Date for PT Re-Evaluation 08/13/21   ? Authorization Type BCBS   ? PT Start Time 1300   ? PT Stop Time 1340   ? PT Time Calculation (min) 40 min   ? Activity Tolerance Patient tolerated treatment well   ? Behavior During Therapy [facility] for tasks assessed/performed   ? ?  ?  ? ?  ? ? ? ? ? ? ? ? ? ?Past Medical History:  ?Diagnosis Date  ? ALLERGIC RHINITIS 12/17/2006  ? ANXIETY 12/17/2006  ? Depression   ? GEN OSTEOARTHROSIS INVOLVING MULTIPLE SITES 12/29/2008  ? Major depression, recurrent, chronic ([place]) 06/03/2017  ? Managed by Noemi Chapel, FNP psych  ? Migraine   ? Migraine without aura or status migrainosus 04/18/2016  ? Preventive with lamictal  ? Myofascial pain syndrome 04/18/2016  ? Nausea with vomiting 02/28/2008  ? Qualifier: Diagnosis of  By: Sherren Mocha MD, Jory Ee   ? Other fatigue 04/18/2016  ? Primary insomnia 04/18/2016  ? Primary osteoarthritis of both feet 04/18/2016  ? Primary osteoarthritis of both hands 04/18/2016  ? Viral URI 02/11/2011  ? ?Past Surgical History:  ?Procedure Laterality Date  ? CESAREAN SECTION  2002  ? DIAGNOSTIC LAPAROSCOPY    ? Diagnosed with endometriosis in approximately 2000  ? TONSILECTOMY, ADENOIDECTOMY, BILATERAL MYRINGOTOMY AND TUBES  2014  ? uterine tumor removed    ? ?Patient Active Problem List  ? Diagnosis Date Noted  ? Major depression, recurrent, chronic ([place]) 06/03/2017  ? Myofascial pain syndrome 04/18/2016  ? Primary osteoarthritis of both hands 04/18/2016  ? Primary osteoarthritis of both feet 04/18/2016  ? Migraine without aura or status migrainosus 04/18/2016  ? Primary insomnia 04/18/2016  ? ALLERGIC RHINITIS 12/17/2006  ? ? ?PCP: Leamon Arnt, MD ? ?REFERRING PROVIDER: Bo Merino, MD ? ?REFERRING DIAG: OA bil feet   ? ?THERAPY DIAG:  ?Pain in left ankle and joints of left foot ? ?Pain in right ankle and joints of right foot ? ?Other abnormalities of gait and mobility ? ?ONSET DATE:  6-8 mo ago ? ?SUBJECTIVE:  ? ?SUBJECTIVE STATEMENT: ? Pt with less pain. Still taking prednisone for poison oak, last day today. Was able to stand in sandals over the weekend without pain.  ? ? ?PERTINENT HISTORY: ?OA,  ? ?PAIN:  ?Are you having pain? Yes: NPRS scale: 7/10 ?Pain location: Bil central heel and fat pad ?Pain description: Aching, sore ?Aggravating factors: First thing in AM, increased standing/walking ?Relieving factors: Rest ? ?PRECAUTIONS: None ? ?WEIGHT BEARING RESTRICTIONS No ? ?FALLS:  ?Has patient fallen in last 6 months? No, Number of falls: 0 ? ? ?PLOF: Independent ? ?PATIENT GOALS  Decreased pain in feet.  ? ? ?OBJECTIVE:  ? ?COGNITION: ? Overall cognitive status: Within functional limits for tasks assessed   ?  ? ?POSTURE:  ?Foot posture: Seated: normal arch height. Standing: mild navicular drop/pronation on L>R.  ? ? ?PALPATION: ?Pain in central heel and into fat pad , bilaterally,  ? ?LE ROM: ?  3/15//23: Hips: WFL, Knee: WNL, Ankle: WFL, mild limitation for DF on L. ? ?LE MMT: ?  06/20/21:  Hips: 4-/5, Knee: 5/5, Ankle: 4 to 4-/5,  ? ?LOWER EXTREMITY SPECIAL TESTS:  ? ? ?  FUNCTIONAL TESTS:  ?Decreased stability with SLS bil, 10-12 sec bil;  ? ?GAIT: ? Decreased and apprehensive heel strike, decreased heel to to propulsion, decreased push off.  ? ? ?TODAY'S TREATMENT: ? ?07/23/21: ?Aerobic:   ?Supine: hooklying: DF lifts x 15  ?Seated:   ?Standing:  SLS with UE rotation 3x5 bil;  Squats x 20;  AirEx: March x 20; March /walk fwd/bwd 10 ft x 4;  HR x 20;  SL reach to chair 2x5 bil/knee bent; SL nose to wall x 10 bil;  ?Stretches:    ?Neuromuscular Re-education: ?Manual Therapy: Ankle: Posterior mobs to inc DF bil;  Manual stretching for DF ; Fat pad massage bil;  ?Self Care:  ? ? ? ?07/20/21: ?Aerobic:   ?Supine: hooklying: DF  lifts x 15  ?Seated:   ?Standing:  SLS with UE rotation 3x5 bil;  Heel raises with stretch on step x 15; Squats x 20; AirEx: SLS ;  HR x 20;  SL reach to chair 2x5 bil/knee bent;  ?Stretches:  Gastroc and soleus at the wall x3 min bil;  ?Neuromuscular Re-education: ?Manual Therapy: Ankle: Posterior mobs to inc DF bil;  Manual stretching for DF ; Fat pad massage bil;  ?Self Care:  ? ? ?07/16/21: ?Aerobic:   ?Supine: hooklying: DF lifts x 15 bil;  ?Seated:   ?Standing:   SLS with UE rotation 3x5 bil;  Heel raises with stretch on step x 15; Squats to mat table x 20; AirEx: Marching x 20;  ?Stretches:  Gastroc and soleus at the wall x3 min bil;  ?Neuromuscular Re-education: ?Manual Therapy: Ankle: Posterior mobs to inc DF bil;  Manual stretching for DF ; Fat pad massage bil;  ?Self Care:  ? ? ?PATIENT EDUCATION:  ?Education details: reviewed HEP,  ?Person educated: Patient ?Education method: Explanation, Demonstration, Tactile cues, Verbal cues, and Handouts ?Education comprehension:  ? ? ?HOME EXERCISE PROGRAM: ?Access Code: [facility] ? ? ?ASSESSMENT: ? ?CLINICAL IMPRESSION: ?Pt with variable pain levels over past few weeks. This week is better, but she is also taking prednisone. Some improvement in stability at ankle and hip/knee with activities today. Pt to benefit from continued care for pain and progression of strength and control of ankle into DF. ? ? ?OBJECTIVE IMPAIRMENTS Abnormal gait, decreased activity tolerance, decreased balance, decreased knowledge of use of DME, decreased mobility, difficulty walking, decreased ROM, decreased strength, increased muscle spasms, impaired flexibility, improper body mechanics, and pain.  ? ?ACTIVITY LIMITATIONS cleaning, community activity, driving, meal prep, laundry, yard work, and shopping.  ? ?PERSONAL FACTORS  none  are also affecting patient's functional outcome.  ? ? ?REHAB POTENTIAL: Good ? ?CLINICAL DECISION MAKING: Stable/uncomplicated ? ?EVALUATION COMPLEXITY:  Low ? ? ?GOALS: ?Goals reviewed with patient? Yes ? ?SHORT TERM GOALS: Target date: 07/02/21 ? ?Pt to be independent with initial HEP ? ?Goal status: INITIAL ? ?2.  Pt to report decreased pain in bil heels to 0-4/10 with standing at least 30 min  ? ?Goal status: INITIAL ? ?3.  Pt to be compliant with footwear at/in home and not go barefoot. ? ?Goal status: INITIAL ? ? ? ?LONG TERM GOALS: Target date: 09/17/2021 ? ?Pt to be independent with final HEP ?Goal status: INITIAL ? ?2.  Pt to be compliant with optimal footwear and/or orthotics, as appropriate for her foot type and dx. ?Goal status: INITIAL ? ?3.  Pt to report decreased pain in ankle/foot/heels to 0-1/10 with standing/walking activity for at least 1 hour.  ?Goal status: INITIAL ? ?  4.  Pt to demo improved strength and NMC of bil foot/ankle to be WNL with SL and dynamic activity.  ?Goal status: INITIAL ? ? ? ? ?PLAN: ?PT FREQUENCY: 1-2x/week ? ?PT DURATION: 8 weeks ? ?PLANNED INTERVENTIONS: Therapeutic exercises, Therapeutic activity, Neuromuscular re-education, Balance training, Gait training, Patient/Family education, Joint manipulation, Joint mobilization, Stair training, Orthotic/Fit training, DME instructions, Dry Needling, Electrical stimulation, Spinal manipulation, Spinal mobilization, Cryotherapy, Moist heat, Taping, Vasopneumatic device, Ultrasound, Ionotophoresis '4mg'$ /ml Dexamethasone, and Manual therapy ? ?PLAN FOR NEXT SESSION: Strengthening for foot/ankle, SL stability, manual for improving DF ROM and fat pad containment, fat pad taping as needed. ? ? ?Lyndee Hensen, PT, DPT ?1:42 PM  07/23/21 ? ? ?

## 2021-07-25 IMAGING — MR MR PELVIS WO/W CM
20 of 21 series · 46 of 48 positions shown · IV contrast (7 ML GADAVIST)
Comparison: Pelvic ultrasound, 10/10/2020

CLINICAL DATA: Pelvic mass, possible exophytic fibroid versus right
ovarian mass

EXAM:
MRI PELVIS WITHOUT AND WITH CONTRAST
TECHNIQUE: Multiplanar multisequence MR imaging of the pelvis was performed
both before and after administration of intravenous contrast.
CONTRAST:  7mL GADAVIST GADOBUTROL 1 MMOL/ML IV SOLN

[Series 3: T2 · coronal · 6.0mm · 1.56mm/px · 1 of 25 slices shown (1 of 4)]
[im 1/25]
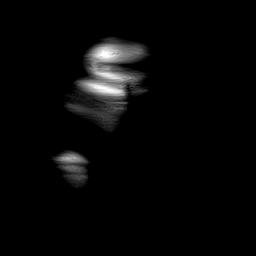

[Series 4: T2 · axial · 5.0mm · 0.51mm/px · 1 of 34 slices shown (2 of 4)]
[im 1/34]
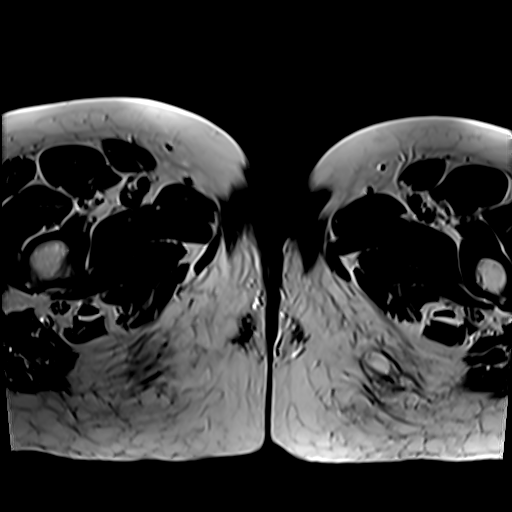

[Series 5: T2 · coronal · 4.0mm · 0.51mm/px · 1 of 34 slices shown (3 of 4)]
[im 1/34]
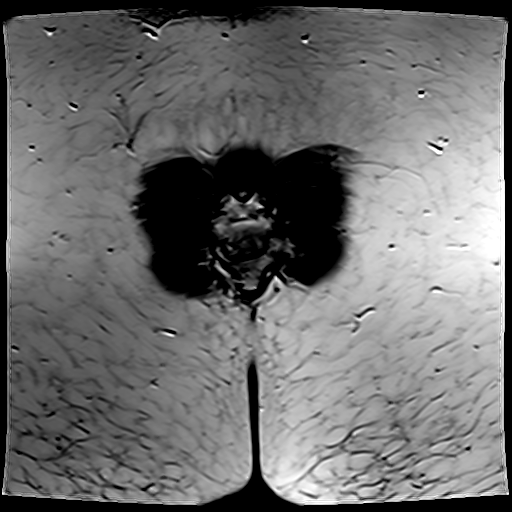

[Series 6: T2 fat-sat · axial · 5.0mm · 0.51mm/px · 1 of 34 slices shown]
[im 1/34]
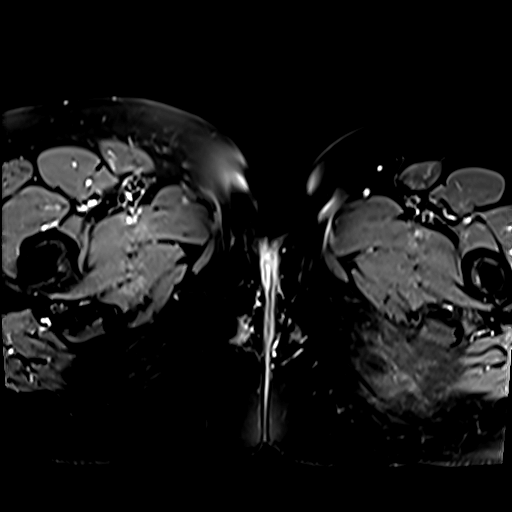

[Series 7: T2 · sagittal · 5.0mm · 0.55mm/px · 2 of 40 slices shown (4 of 4)]
[im 1/40]
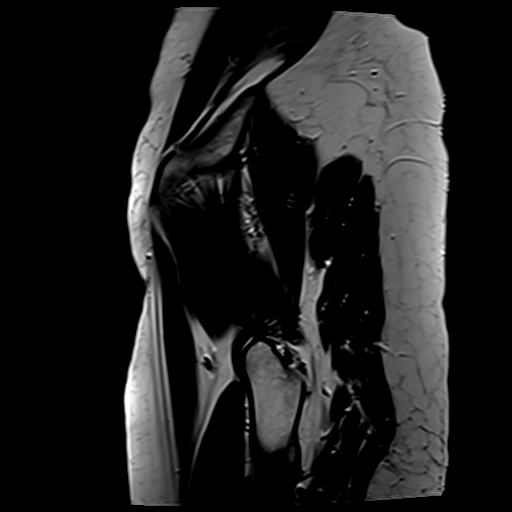
[im 40/40]
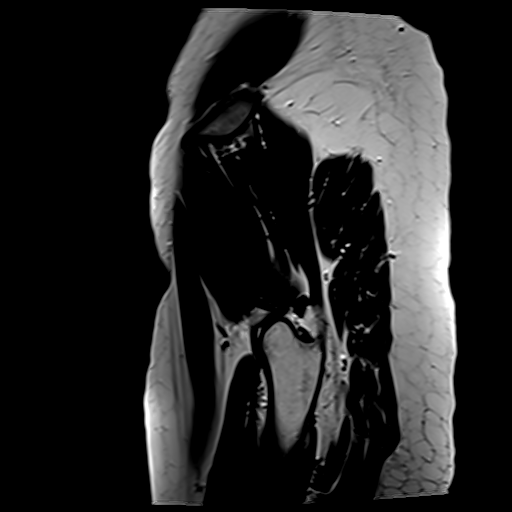

[Series 8: T1 · axial · 4.0mm · 0.84mm/px · z∈[-97,+139]mm · 3 of 60 slices shown (1 of 2)]
[im 1/60]
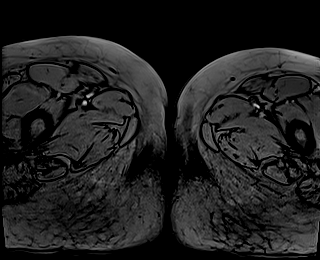
[im 30/60]
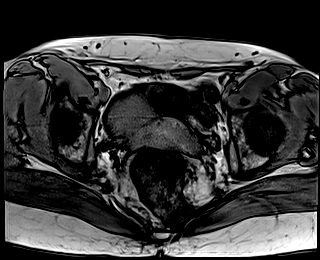
[im 60/60]
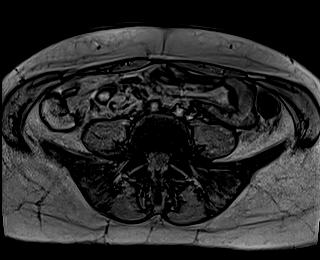

[Series 9: T1 · axial · 4.0mm · 0.84mm/px · z∈[-97,+139]mm · 3 of 60 slices shown (2 of 2)]
[im 1/60]
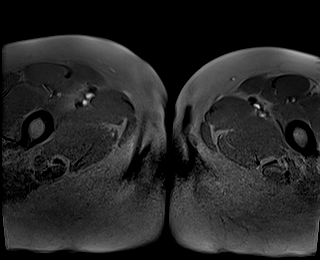
[im 30/60]
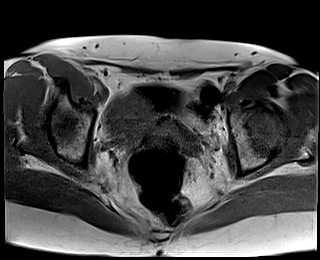
[im 60/60]
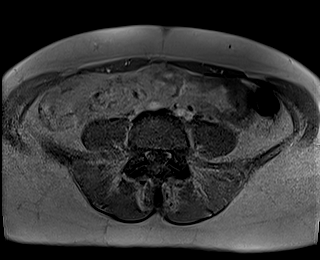

[Series 10: DWI · axial · 5.0mm · 2.80mm/px · z∈[-51,+94]mm · 3 of 78 slices shown (1 of 3)]
[im 1/78]
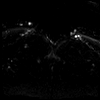
[im 39/78]
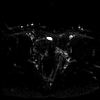
[im 78/78]
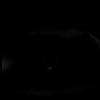

[Series 11: DWI · axial · 5.0mm · 2.80mm/px · 1 of 30 slices shown (2 of 3)]
[im 1/30]
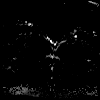

[Series 12: DWI · axial · 5.0mm · 2.80mm/px · 1 of 30 slices shown (3 of 3)]
[im 1/30]
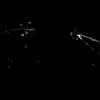

[Series 14: T1 dynamic · axial · 3.0mm · 0.84mm/px · z∈[-73,+116]mm · 3 of 64 slices shown (1 of 7)]
[im 1/64]
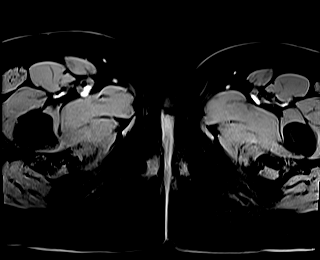
[im 32/64]
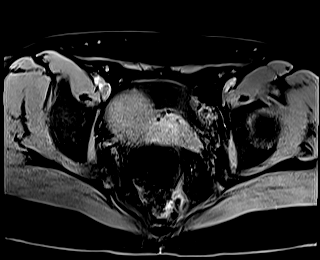
[im 64/64]
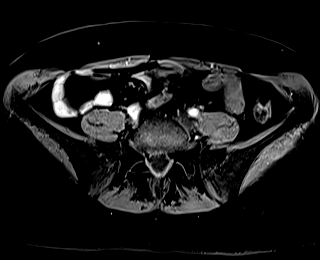

[Series 18: T1 dynamic · axial · 3.0mm · 0.84mm/px · z∈[-73,+116]mm · 3 of 64 slices shown (2 of 7)]
[im 1/64]
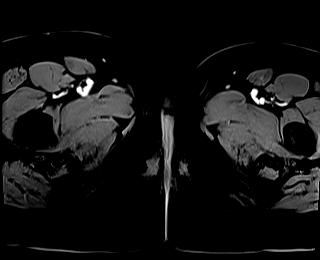
[im 32/64]
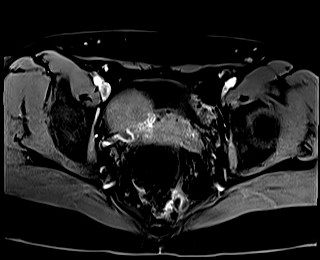
[im 64/64]
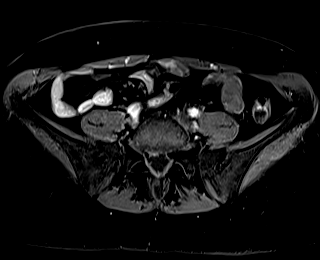

[Series 19: T1 dynamic · axial · 3.0mm · 0.84mm/px · z∈[-73,+116]mm · 3 of 63 slices shown (3 of 7)]
[im 1/63]
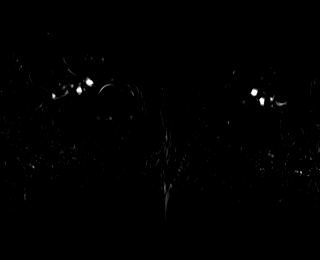
[im 32/63]
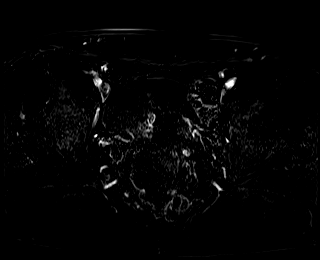
[im 63/63]
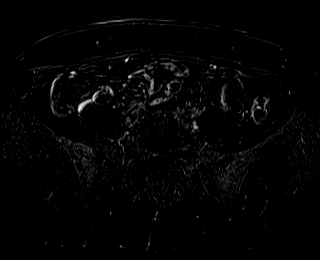

[Series 22: T1 dynamic · axial · 3.0mm · 0.84mm/px · z∈[-73,+116]mm · 3 of 64 slices shown (4 of 7)]
[im 1/64]
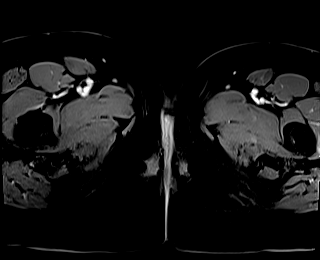
[im 32/64]
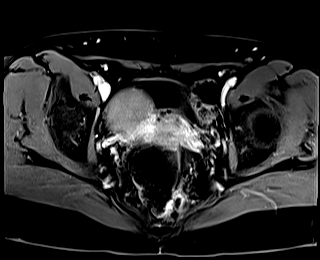
[im 64/64]
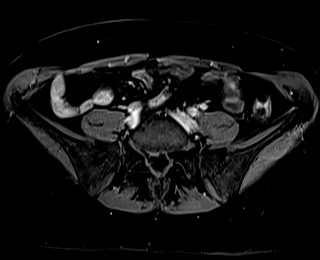

[Series 23: T1 dynamic · axial · 3.0mm · 0.84mm/px · z∈[-73,+116]mm · 3 of 64 slices shown (5 of 7)]
[im 1/64]
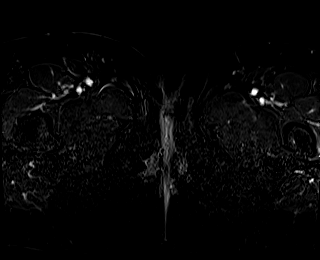
[im 32/64]
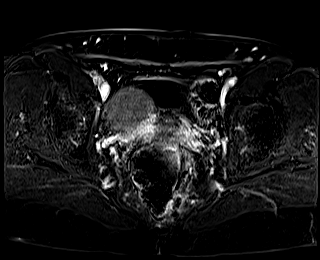
[im 64/64]
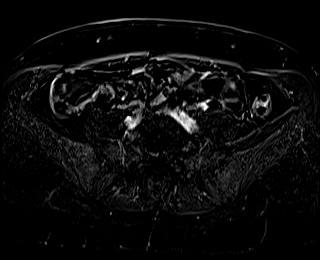

[Series 26: T1 dynamic · axial · 3.0mm · 0.84mm/px · z∈[-73,+116]mm · 3 of 64 slices shown (6 of 7)]
[im 1/64]
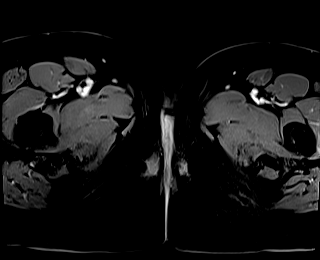
[im 32/64]
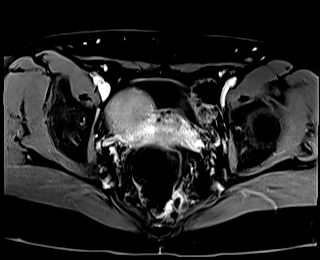
[im 64/64]
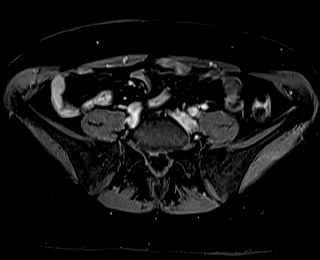

[Series 27: T1 dynamic · axial · 3.0mm · 0.84mm/px · z∈[-73,+116]mm · 3 of 64 slices shown (7 of 7)]
[im 1/64]
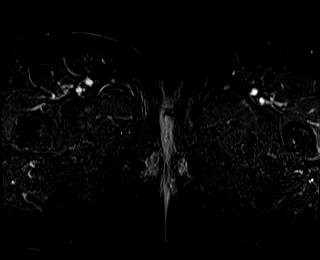
[im 32/64]
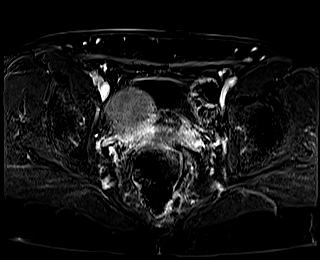
[im 64/64]
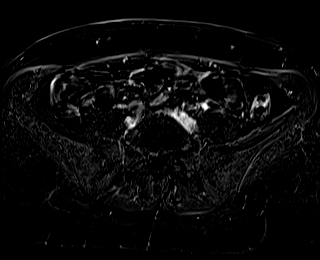

[Series 29: T1 dynamic post-contrast · axial · 3.0mm · 1.06mm/px · z∈[-97,+140]mm · 4 of 80 slices shown (1 of 2)]
[im 1/80]
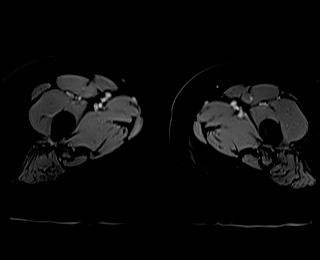
[im 27/80]
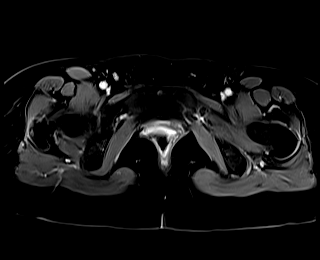
[im 53/80]
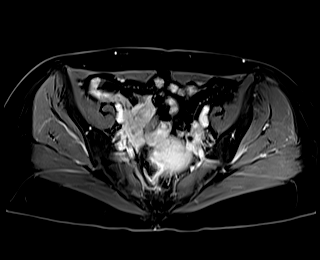
[im 80/80]
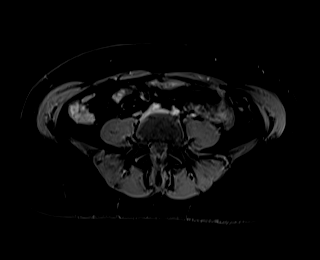

[Series 31: T1 dynamic post-contrast · sagittal · 3.0mm · 0.88mm/px · 3 of 72 slices shown (2 of 2)]
[im 1/72]
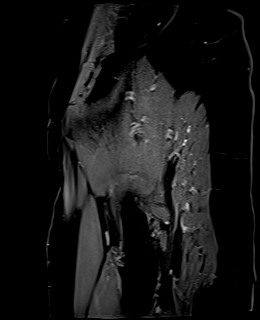
[im 36/72]
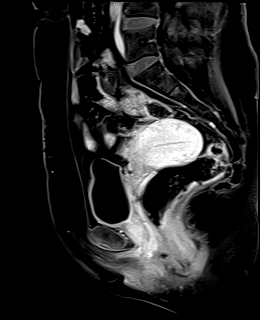
[im 72/72]
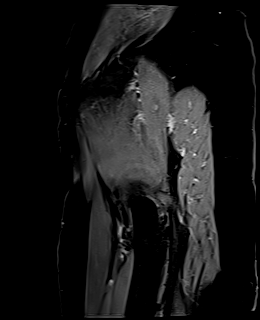

[Series 32: T2 post-contrast · sagittal · 5.0mm · 0.55mm/px · 1 of 40 slices shown]
[im 1/40]
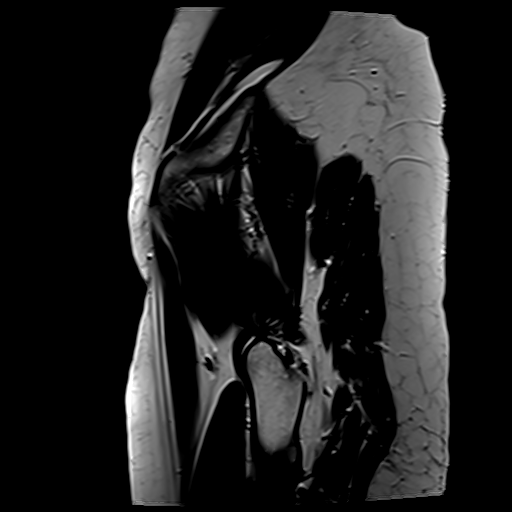

[46 of 48 positions shown; findings below may reference images not displayed]

FINDINGS: Urinary Tract:  No abnormality visualized.

Bowel:  Unremarkable visualized pelvic bowel loops.

Vascular/Lymphatic: No pathologically enlarged lymph nodes. No
significant vascular abnormality seen.

Reproductive: Retroflexed uterus. There is a rounded exophytic mass
arising from the right anterior aspect of the uterine body measuring
5.0 x 5.0 cm (series 6, image 15). This is clearly distinct from the
adjacent normal right ovary (series 6, image 15). Subcentimeter
cysts of the left ovary.

Other:  None.

Musculoskeletal: No suspicious bone lesions identified.
IMPRESSION: 1. There is a rounded exophytic mass arising from the right anterior
aspect of the uterine body measuring 5.0 x 5.0 cm. This is clearly
distinct from the adjacent normal right ovary and consistent with an
exophytic submucosal fibroid.
2. No suspicious ovarian or adnexal lesion.

## 2021-07-27 ENCOUNTER — Encounter: Payer: Federal, State, Local not specified - PPO | Admitting: Physical Therapy

## 2021-08-03 ENCOUNTER — Ambulatory Visit: Payer: Federal, State, Local not specified - PPO | Admitting: Physical Therapy

## 2021-08-03 ENCOUNTER — Encounter: Payer: Self-pay | Admitting: Physical Therapy

## 2021-08-03 DIAGNOSIS — M25571 Pain in right ankle and joints of right foot: Secondary | ICD-10-CM

## 2021-08-03 DIAGNOSIS — R2689 Other abnormalities of gait and mobility: Secondary | ICD-10-CM

## 2021-08-03 DIAGNOSIS — M25572 Pain in left ankle and joints of left foot: Secondary | ICD-10-CM | POA: Diagnosis not present

## 2021-08-03 NOTE — Therapy (Signed)
?OUTPATIENT PHYSICAL THERAPY  TREATMENT ? ? ?Patient Name: Jacqueline Sloan ?MRN: [id number] ?DOB:[birth year], 56 y.o., female ?Today's Date: 08/03/2021 ? ? PT End of Session - 08/03/21 1112   ? ? Visit Number 10   ? Number of Visits 12   ? Date for PT Re-Evaluation 08/13/21   ? Authorization Type BCBS   ? PT Start Time 1104   ? PT Stop Time 8546   ? PT Time Calculation (min) 39 min   ? Activity Tolerance Patient tolerated treatment well   ? Behavior During Therapy [facility] for tasks assessed/performed   ? ?  ?  ? ?  ? ? ? ? ? ? ? ? ? ?Past Medical History:  ?Diagnosis Date  ? ALLERGIC RHINITIS 12/17/2006  ? ANXIETY 12/17/2006  ? Depression   ? GEN OSTEOARTHROSIS INVOLVING MULTIPLE SITES 12/29/2008  ? Major depression, recurrent, chronic ([place]) 06/03/2017  ? Managed by Noemi Chapel, FNP psych  ? Migraine   ? Migraine without aura or status migrainosus 04/18/2016  ? Preventive with lamictal  ? Myofascial pain syndrome 04/18/2016  ? Nausea with vomiting 02/28/2008  ? Qualifier: Diagnosis of  By: Sherren Mocha MD, Jory Ee   ? Other fatigue 04/18/2016  ? Primary insomnia 04/18/2016  ? Primary osteoarthritis of both feet 04/18/2016  ? Primary osteoarthritis of both hands 04/18/2016  ? Viral URI 02/11/2011  ? ?Past Surgical History:  ?Procedure Laterality Date  ? CESAREAN SECTION  2002  ? DIAGNOSTIC LAPAROSCOPY    ? Diagnosed with endometriosis in approximately 2000  ? TONSILECTOMY, ADENOIDECTOMY, BILATERAL MYRINGOTOMY AND TUBES  2014  ? uterine tumor removed    ? ?Patient Active Problem List  ? Diagnosis Date Noted  ? Major depression, recurrent, chronic ([place]) 06/03/2017  ? Myofascial pain syndrome 04/18/2016  ? Primary osteoarthritis of both hands 04/18/2016  ? Primary osteoarthritis of both feet 04/18/2016  ? Migraine without aura or status migrainosus 04/18/2016  ? Primary insomnia 04/18/2016  ? ALLERGIC RHINITIS 12/17/2006  ? ? ?PCP: Leamon Arnt, MD ? ?REFERRING PROVIDER: Bo Merino, MD ? ?REFERRING DIAG: OA bil feet   ? ?THERAPY DIAG:  ?Pain in left ankle and joints of left foot ? ?Pain in right ankle and joints of right foot ? ?Other abnormalities of gait and mobility ? ?ONSET DATE:  6-8 mo ago ? ?SUBJECTIVE:  ? ?SUBJECTIVE STATEMENT: ? Pt with variable pain. She has decreased pain since Eval, but still has soreness at base of bil heels. Has continued to wear her recovery sandals with the most comfort.  ? ? ?PERTINENT HISTORY: ?OA,  ? ?PAIN:  ?Are you having pain? Yes: NPRS scale: 4-6/10 ?Pain location: Bil central heel and fat pad ?Pain description: Aching, sore ?Aggravating factors: First thing in AM, increased standing/walking ?Relieving factors: Rest ? ?PRECAUTIONS: None ? ?WEIGHT BEARING RESTRICTIONS No ? ?FALLS:  ?Has patient fallen in last 6 months? No, Number of falls: 0 ? ? ?PLOF: Independent ? ?PATIENT GOALS  Decreased pain in feet.  ? ? ?OBJECTIVE:  ? ?COGNITION: ? Overall cognitive status: Within functional limits for tasks assessed   ?  ? ?POSTURE:  ?Foot posture: Seated: normal arch height. Standing: mild navicular drop/pronation on L>R.  ? ? ?PALPATION: ?Pain in central heel and into fat pad , bilaterally,  ? ?LE ROM: ?  3/15//23: Hips: WFL, Knee: WNL, Ankle: WFL, mild limitation for DF on L. ? ?LE MMT: ?  06/20/21:  Hips: 4-/5, Knee: 5/5, Ankle: 4 to 4-/5,  ? ?LOWER  EXTREMITY SPECIAL TESTS:  ? ? ?FUNCTIONAL TESTS:  ?Decreased stability with SLS bil, 10-12 sec bil;  ? ?GAIT: ? Decreased and apprehensive heel strike, decreased heel to to propulsion, decreased push off.  ? ? ?TODAY'S TREATMENT: ? ?08/03/21: ?Aerobic:   ?Supine:  ?Seated:   ?Standing:   Squats x 20 with education on back and mechancis;   March /walk fwd/bwd 10 ft x 6 ea;  HR x 20;  HR on step x 20;  ?Stretches:   Gastroc and soleus stretches at wall  x3 bil;  ?Neuromuscular Re-education: ?Manual Therapy: Ankle: Posterior mobs to inc DF bil;  Manual stretching for DF ; ?Self Care: Education on footwear to continue wearing, Need to increase HEP  frequency, Discussed transition to gym exercise, and need for appt with ortho/foot MD if pain does not improve in near future.  ? ? ? ?PATIENT EDUCATION:  ?Education details: reviewed HEP, discussed d/c plan, MD referral and footwear.  ?Person educated: Patient ?Education method: Explanation, Demonstration, Tactile cues, Verbal cues, and Handouts ?Education comprehension:  ? ? ?HOME EXERCISE PROGRAM: ?Access Code: [facility] ? ? ?ASSESSMENT: ? ?CLINICAL IMPRESSION: ?Pt continues to have variable pain. She has had pain relief since start of PT, and is doing well/compliant with footwear. She has admittedly not done HEP as often as recommended, but would like to transition to exercising at the gym. She has most tenderness in central heels and in fat pad/base of heels. She was given referral info for foot Dr, she will call and make appt if pain does not improve in near future. Discussed hold on PT at this time, due to continued/variable pain, pt will continue HEP and gym workouts, and will f/u with MD if pain does not improve. Pt in agreement with plan.  ? ? ?OBJECTIVE IMPAIRMENTS Abnormal gait, decreased activity tolerance, decreased balance, decreased knowledge of use of DME, decreased mobility, difficulty walking, decreased ROM, decreased strength, increased muscle spasms, impaired flexibility, improper body mechanics, and pain.  ? ?ACTIVITY LIMITATIONS cleaning, community activity, driving, meal prep, laundry, yard work, and shopping.  ? ?PERSONAL FACTORS  none  are also affecting patient's functional outcome.  ? ? ?REHAB POTENTIAL: Good ? ?CLINICAL DECISION MAKING: Stable/uncomplicated ? ?EVALUATION COMPLEXITY: Low ? ? ?GOALS: ?Goals reviewed with patient? Yes ? ?SHORT TERM GOALS: Target date: 07/02/21 ? ?Pt to be independent with initial HEP ? ?Goal status: MET ? ?2.  Pt to report decreased pain in bil heels to 0-4/10 with standing at least 30 min  ? ?Goal status: MET ? ?3.  Pt to be compliant with footwear at/in  home and not go barefoot. ? ?Goal status: MET ? ? ? ?LONG TERM GOALS: Target date: 09/28/2021 ? ?Pt to be independent with final HEP ?Goal status: MET ? ?2.  Pt to be compliant with optimal footwear and/or orthotics, as appropriate for her foot type and dx. ?Goal status: MET ? ?3.  Pt to report decreased pain in ankle/foot/heels to 0-1/10 with standing/walking activity for at least 1 hour.  ?Goal status: IN PROGRESS ? ?4.  Pt to demo improved strength and NMC of bil foot/ankle to be WNL with SL and dynamic activity.  ?Goal status: IN PROGRESS ? ? ? ? ?PLAN: ?PT FREQUENCY: 1-2x/week ? ?PT DURATION: 8 weeks ? ?PLANNED INTERVENTIONS: Therapeutic exercises, Therapeutic activity, Neuromuscular re-education, Balance training, Gait training, Patient/Family education, Joint manipulation, Joint mobilization, Stair training, Orthotic/Fit training, DME instructions, Dry Needling, Electrical stimulation, Spinal manipulation, Spinal mobilization, Cryotherapy, Moist heat, Taping, Vasopneumatic device,  Ultrasound, Ionotophoresis 1m/ml Dexamethasone, and Manual therapy ? ?PLAN FOR NEXT SESSION:  ? ?LLyndee Hensen PT, DPT ?12:08 PM  08/03/21 ? ? ?

## 2021-08-17 ENCOUNTER — Encounter: Payer: Self-pay | Admitting: *Deleted

## 2021-08-17 ENCOUNTER — Ambulatory Visit (INDEPENDENT_AMBULATORY_CARE_PROVIDER_SITE_OTHER): Payer: Federal, State, Local not specified - PPO | Admitting: Family Medicine

## 2021-08-17 ENCOUNTER — Encounter: Payer: Self-pay | Admitting: Family Medicine

## 2021-08-17 VITALS — BP 112/76 | HR 68 | Temp 98.2°F | Ht 66.5 in | Wt 142.5 lb

## 2021-08-17 DIAGNOSIS — Z800 Family history of malignant neoplasm of digestive organs: Secondary | ICD-10-CM

## 2021-08-17 DIAGNOSIS — Z0001 Encounter for general adult medical examination with abnormal findings: Secondary | ICD-10-CM | POA: Diagnosis not present

## 2021-08-17 DIAGNOSIS — M7918 Myalgia, other site: Secondary | ICD-10-CM

## 2021-08-17 DIAGNOSIS — F339 Major depressive disorder, recurrent, unspecified: Secondary | ICD-10-CM | POA: Diagnosis not present

## 2021-08-17 DIAGNOSIS — Z1159 Encounter for screening for other viral diseases: Secondary | ICD-10-CM | POA: Diagnosis not present

## 2021-08-17 DIAGNOSIS — Z0000 Encounter for general adult medical examination without abnormal findings: Secondary | ICD-10-CM | POA: Diagnosis not present

## 2021-08-17 DIAGNOSIS — J301 Allergic rhinitis due to pollen: Secondary | ICD-10-CM | POA: Diagnosis not present

## 2021-08-17 DIAGNOSIS — F5101 Primary insomnia: Secondary | ICD-10-CM

## 2021-08-17 DIAGNOSIS — G43009 Migraine without aura, not intractable, without status migrainosus: Secondary | ICD-10-CM

## 2021-08-17 LAB — COMPREHENSIVE METABOLIC PANEL
ALT: 14 U/L (ref 0–35)
AST: 17 U/L (ref 0–37)
Albumin: 4.4 g/dL (ref 3.5–5.2)
Alkaline Phosphatase: 52 U/L (ref 39–117)
BUN: 11 mg/dL (ref 6–23)
CO2: 27 mEq/L (ref 19–32)
Calcium: 9.6 mg/dL (ref 8.4–10.5)
Chloride: 103 mEq/L (ref 96–112)
Creatinine, Ser: 0.68 mg/dL (ref 0.40–1.20)
GFR: 98.39 mL/min (ref >60.00)
Glucose, Bld: 93 mg/dL (ref 70–99)
Potassium: 3.8 mEq/L (ref 3.5–5.1)
Sodium: 139 mEq/L (ref 135–145)
Total Bilirubin: 0.7 mg/dL (ref 0.2–1.2)
Total Protein: 7.2 g/dL (ref 6.0–8.3)

## 2021-08-17 LAB — CBC WITH DIFFERENTIAL/PLATELET
Basophils Absolute: 0 10*3/uL (ref 0.0–0.1)
Basophils Relative: 1 % (ref 0.0–3.0)
Eosinophils Absolute: 0.1 10*3/uL (ref 0.0–0.7)
Eosinophils Relative: 2.7 % (ref 0.0–5.0)
HCT: 41.1 % (ref 36.0–46.0)
Hemoglobin: 13.8 g/dL (ref 12.0–15.0)
Lymphocytes Relative: 45.9 % (ref 12.0–46.0)
Lymphs Abs: 1.8 10*3/uL (ref 0.7–4.0)
MCHC: 33.7 g/dL (ref 30.0–36.0)
MCV: 94.6 fl (ref 78.0–100.0)
Monocytes Absolute: 0.3 10*3/uL (ref 0.1–1.0)
Monocytes Relative: 8.8 % (ref 3.0–12.0)
Neutro Abs: 1.6 10*3/uL (ref 1.4–7.7)
Neutrophils Relative %: 41.6 % — ABNORMAL LOW (ref 43.0–77.0)
Platelets: 283 10*3/uL (ref 150.0–400.0)
RBC: 4.34 Mil/uL (ref 3.87–5.11)
RDW: 13.5 % (ref 11.5–15.5)
WBC: 3.9 10*3/uL — ABNORMAL LOW (ref 4.0–10.5)

## 2021-08-17 LAB — LIPID PANEL
Cholesterol: 234 mg/dL — ABNORMAL HIGH (ref 0–200)
HDL: 73.7 mg/dL (ref >39.00)
LDL Cholesterol: 142 mg/dL — ABNORMAL HIGH (ref 0–99)
NonHDL: 160.41
Total CHOL/HDL Ratio: 3
Triglycerides: 94 mg/dL (ref 0.0–149.0)
VLDL: 18.8 mg/dL (ref 0.0–40.0)

## 2021-08-17 LAB — TSH: TSH: 1.26 u[IU]/mL (ref 0.35–5.50)

## 2021-08-17 NOTE — Patient Instructions (Addendum)
Please return in 12 months for your annual complete physical; please come fasting. Please sign a release of records for [facility] for your mammogram and pap smear results.  ? ?I will release your lab results to you on your MyChart account with further instructions. You may see the results before I do, but when I review them I will send you a message with my report or have my assistant call you if things need to be discussed. Please reply to my message with any questions. Thank you!  ? ?If you have any questions or concerns, please don't hesitate to send me a message via MyChart or call the office at [phone]. Thank you for visiting with [place] today! It's our pleasure caring for you.  ? ?Please do these things to maintain good health! ? ?Exercise at least 30-45 minutes a day,  4-5 days a week.  ?Eat a low-fat diet with lots of fruits and vegetables, up to 7-9 servings per day. ?Drink plenty of water daily. Try to drink 8 8oz glasses per day. ?Seatbelts can save your life. Always wear your seatbelt. ?Place Smoke Detectors on every level of your home and check batteries every year. ?Schedule an appointment with an eye doctor for an eye exam every 1-2 years ?Safe sex - use condoms to protect yourself from STDs if you could be exposed to these types of infections. Use birth control if you do not want to become pregnant and are sexually active. ?Avoid heavy alcohol use. If you drink, keep it to less than 2 drinks/day and not every day. ?[place].  Choose someone you trust that could speak for you if you became unable to speak for yourself. ?Depression is common in our stressful world.If you're feeling down or losing interest in things you normally enjoy, please come in for a visit. ?If anyone is threatening or hurting you, please get help. Physical or Emotional Violence is never OK.   ?

## 2021-08-17 NOTE — Progress Notes (Signed)
?Subjective  ?Chief Complaint  ?Patient presents with  ? Annual Exam  ?  Fasting ?  ? ? ?HPI: Jacqueline Sloan is a 56 y.o. female who presents to [place] at [place] today for a Female Wellness Visit. She also has the concerns and/or needs as listed above in the chief complaint. These will be addressed in addition to the Health Maintenance Visit.  ? ?Wellness Visit: annual visit with health maintenance review and exam without Pap ? ?Health maintenance: 56 year old female here for physical, last here in 2019.  Overall doing well.  Sees GYN and rheumatology.  Had recent female wellness exam with normal Pap smear and mammogram reported.  We will request records.  Healthy lifestyle.  Immunizations are current.  Married, children grown out of the home. ?Chronic disease f/u and/or acute problem visit: (deemed necessary to be done in addition to the wellness visit): ?Chronic problems include myofascial pain syndrome followed by rheumatology on Neurontin.  Major chronic depression which is now on remission.  Primary insomnia which has improved.  Migraines on Lamictal for prevention.  And family history of pancreatic cancer on q. 7 years colonoscopy screens.  She had negative genetic testing in the past.  No new concerns.  No problems with medications. ? ?Assessment  ?1. Annual physical exam   ?2. Seasonal allergic rhinitis due to pollen   ?3. Myofascial pain syndrome   ?4. Major depression, recurrent, chronic ([place])   ?5. Primary insomnia   ?6. Migraine without aura and without status migrainosus, not intractable   ?7. Need for hepatitis C screening test   ?8. Family history of pancreatic cancer   ? ?  ?Plan  ?Female Wellness Visit: ?Age appropriate Health Maintenance and Prevention measures were discussed with patient. Included topics are cancer screening recommendations, ways to keep healthy (see AVS) including dietary and exercise recommendations, regular eye and dental care, use of seat belts, and  avoidance of moderate alcohol use and tobacco use.  We will request GYN records.  Screens are current ?BMI: discussed patient's BMI and encouraged positive lifestyle modifications to help get to or maintain a target BMI. ?HM needs and immunizations were addressed and ordered. See below for orders. See HM and immunization section for updates. ?Routine labs and screening tests ordered including cmp, cbc and lipids where appropriate. ?Discussed recommendations regarding Vit D and calcium supplementation (see AVS) ? ?Chronic disease management visit and/or acute problem visit: ?Myofascial pain syndrome stable: Monitoring with rheumatology.  Continue gabapentin. ?History of depression: No longer on medications.  Stable managed by psychiatry ?Migraines are not controlled with Lamictal.  No abortives ?Allergies are stable insomnia stable. ?Reviewed colonoscopy in 2019, normal.  Recheck 7 years with Dr. Collene Mares ? ?Follow up: Return in about 1 year (around 08/18/2022) for complete physical.  ?Orders Placed This Encounter  ?Procedures  ? CBC with Differential/Platelet  ? Comprehensive metabolic panel  ? Lipid panel  ? TSH  ? HIV Antibody (routine testing w rflx)  ? Hepatitis C antibody  ? ?No orders of the defined types were placed in this encounter. ? ?  ? ?Body mass index is 22.66 kg/m?. ?Wt Readings from Last 3 Encounters:  ?08/17/21 142 lb 8 oz (64.6 kg)  ?06/06/21 150 lb 3.2 oz (68.1 kg)  ?10/03/20 151 lb (68.5 kg)  ? ? ? ?Patient Active Problem List  ? Diagnosis Date Noted  ? Major depression, recurrent, chronic ([place]) 06/03/2017  ?  Priority: High  ?  Managed by Lattie Haw  Dorethea Clan, [place] psych ? ?  ? Myofascial pain syndrome 04/18/2016  ?  Priority: High  ? Primary osteoarthritis of both hands 04/18/2016  ?  Priority: Medium   ? Primary osteoarthritis of both feet 04/18/2016  ?  Priority: Medium   ? Migraine without aura or status migrainosus 04/18/2016  ?  Priority: Medium   ?  Preventive with lamictal ? ?  ? Primary insomnia  04/18/2016  ?  Priority: Medium   ? Seasonal allergic rhinitis due to pollen 12/17/2006  ?  Priority: Low  ? Family history of pancreatic cancer 08/17/2021  ? ?Health Maintenance  ?Topic Date Due  ? HIV Screening  Never done  ? Hepatitis C Screening  Never done  ? Zoster Vaccines- Shingrix (1 of 2) 11/17/2021 (Originally 09/16/2016)  ? COVID-19 Vaccine (3 - Booster for [place] series) 03/20/2022 (Originally 01/29/2020)  ? TETANUS/TDAP  08/18/2022 (Originally 05/03/2020)  ? INFLUENZA VACCINE  11/06/2021  ? MAMMOGRAM  04/22/2022  ? COLONOSCOPY (Pts 45-39yr Insurance coverage will need to be confirmed)  03/02/2025  ? PAP SMEAR-Modifier  04/12/2026  ? HPV VACCINES  Aged Out  ? ?Immunization History  ?Administered Date(s) Administered  ? PFIZER(Purple Top)SARS-COV-2 Vaccination 11/11/2019, 12/04/2019  ? Td 04/09/1999, 05/03/2010  ? ?We updated and reviewed the patient's past history in detail and it is documented below. ?Allergies: ?Patient is allergic to penicillins and other. ?Past Medical History ?Patient  has a past medical history of ALLERGIC RHINITIS (12/17/2006), ANXIETY (12/17/2006), Depression, GEN OSTEOARTHROSIS INVOLVING MULTIPLE SITES (12/29/2008), Major depression, recurrent, chronic ([facility]) (06/03/2017), Migraine, Migraine without aura or status migrainosus (04/18/2016), Myofascial pain syndrome (04/18/2016), Nausea with vomiting (02/28/2008), Other fatigue (04/18/2016), Primary insomnia (04/18/2016), Primary osteoarthritis of both feet (04/18/2016), Primary osteoarthritis of both hands (04/18/2016), and Viral URI (02/11/2011). ?Past Surgical History ?Patient  has a past surgical history that includes Cesarean section (2002); Tonsilectomy, adenoidectomy, bilateral myringotomy and tubes (2014); Diagnostic laparoscopy; and uterine tumor removed. ?Family History: ?Patient family history includes Arthritis in her maternal grandfather and mother; Breast cancer in an other family member; Congestive Heart Failure in  her mother; Healthy in her son and son; Hypertension in her mother; Prostate cancer in her maternal grandfather; Rheum arthritis in her maternal grandmother. ?Social History:  ?Patient  reports that she has never smoked. She has never been exposed to tobacco smoke. She has never used smokeless tobacco. She reports current alcohol use. She reports that she does not use drugs. ? ?Review of Systems: ?Constitutional: negative for fever or malaise ?Ophthalmic: negative for photophobia, double vision or loss of vision ?Cardiovascular: negative for chest pain, dyspnea on exertion, or new LE swelling ?Respiratory: negative for SOB or persistent cough ?Gastrointestinal: negative for abdominal pain, change in bowel habits or melena ?Genitourinary: negative for dysuria or gross hematuria, no abnormal uterine bleeding or disharge ?Musculoskeletal: negative for new gait disturbance or muscular weakness ?Integumentary: negative for new or persistent rashes, no breast lumps ?Neurological: negative for TIA or stroke symptoms ?Psychiatric: negative for SI or delusions ?Allergic/Immunologic: negative for hives ? ?Patient Care Team  ?  Relationship Specialty Notifications Start End  ?ALeamon Arnt MD PCP - General Family Medicine  06/03/17   ?MJuanita Craver MD Consulting Physician Gastroenterology  08/17/21   ? ? ?Objective  ?Vitals: BP 112/76   Pulse 68   Temp 98.2 ?F (36.8 ?C) (Temporal)   Ht 5' 6.5" (1.689 m)   Wt 142 lb 8 oz (64.6 kg)   SpO2 97%   BMI 22.66 kg/m?  ?  General:  Well developed, well nourished, no acute distress  ?Psych:  Alert and orientedx3,normal mood and affect ?HEENT:  Normocephalic, atraumatic, non-icteric sclera,  supple neck without adenopathy, mass or thyromegaly ?Cardiovascular:  Normal S1, S2, RRR without gallop, rub or murmur ?Respiratory:  Good breath sounds bilaterally, CTAB with normal respiratory effort ?Gastrointestinal: normal bowel sounds, soft, non-tender, no noted masses. No HSM ?MSK: no  deformities, contusions. Joints are without erythema or swelling.  ?Skin:  Warm, no rashes or suspicious lesions noted ? ? ?Commons side effects, risks, benefits, and alternatives for medications and treatment plan pr

## 2021-08-20 LAB — HIV ANTIBODY (ROUTINE TESTING W REFLEX): HIV 1&2 Ab, 4th Generation: NONREACTIVE

## 2021-08-20 LAB — HEPATITIS C ANTIBODY
Hepatitis C Ab: NONREACTIVE
SIGNAL TO CUT-OFF: 0.08 (ref <1.00)

## 2021-11-08 DIAGNOSIS — H40033 Anatomical narrow angle, bilateral: Secondary | ICD-10-CM | POA: Diagnosis not present

## 2021-11-28 NOTE — Progress Notes (Addendum)
Office Visit Note  Patient: Jacqueline Sloan             Date of Birth: [birth year]           MRN: [id number]             PCP: Leamon Arnt, MD Referring: Leamon Arnt, MD Visit Date: 12/12/2021 Occupation: '@GUAROCC'$ @  Subjective:  Pain in both feet   History of Present Illness: Jacqueline Sloan is a 56 y.o. female with history of myofascial pain and osteoarthritis.  Patient was last seen in the office on 06/06/2021.  She presents today with ongoing pain in both feet.  She states her pain initially started over 6 months ago. No injury or fall prior to the onset of symptoms. She was evaluated by Dr. Gershon Mussel at the foot center and was diagnosed with subtalar bursitis and bone spurring.  She underwent several injections as well as tried wearing a boot on a daily basis on both feet for 1 month.  The injections provided temporary relief but her symptoms have returned.  She went to the shoe market and has been fitted for new balances as well as proper fitting sandals.  She went to physical therapy with minimal improvement in her symptoms  and discontinued 2-3 months ago. She has not been working with a Physiological scientist at [employer].   She denies any other joint pain or joint swelling at this time.   Activities of Daily Living:  Patient reports morning stiffness for all day. Patient Reports nocturnal pain.  Difficulty dressing/grooming: Denies Difficulty climbing stairs: Denies Difficulty getting out of chair: Denies Difficulty using hands for taps, buttons, cutlery, and/or writing: Denies  Review of Systems  Constitutional:  Positive for fatigue.  HENT:  Negative for mouth sores and mouth dryness.   Eyes:  Negative for dryness.  Respiratory:  Negative for shortness of breath.   Cardiovascular:  Negative for chest pain and palpitations.  Gastrointestinal:  Negative for blood in stool, constipation and diarrhea.  Endocrine: Negative for increased urination.  Genitourinary:  Negative for  involuntary urination.  Musculoskeletal:  Positive for joint pain, joint pain, joint swelling and morning stiffness. Negative for gait problem, myalgias, muscle weakness, muscle tenderness and myalgias.  Skin:  Negative for color change, rash, hair loss and sensitivity to sunlight.  Allergic/Immunologic: Negative for susceptible to infections.  Neurological:  Negative for dizziness and headaches.  Hematological:  Negative for swollen glands.  Psychiatric/Behavioral:  Positive for depressed mood and sleep disturbance. The patient is nervous/anxious.     PMFS History:  Patient Active Problem List   Diagnosis Date Noted   Family history of pancreatic cancer 08/17/2021   Major depression, recurrent, chronic ([place]) 06/03/2017   Myofascial pain syndrome 04/18/2016   Primary osteoarthritis of both hands 04/18/2016   Primary osteoarthritis of both feet 04/18/2016   Migraine without aura or status migrainosus 04/18/2016   Primary insomnia 04/18/2016   Seasonal allergic rhinitis due to pollen 12/17/2006    Past Medical History:  Diagnosis Date   ALLERGIC RHINITIS 12/17/2006   ANXIETY 12/17/2006   Depression    GEN OSTEOARTHROSIS INVOLVING MULTIPLE SITES 12/29/2008   Major depression, recurrent, chronic ([place]) 06/03/2017   Managed by Noemi Chapel, FNP psych   Migraine    Migraine without aura or status migrainosus 04/18/2016   Preventive with lamictal   Myofascial pain syndrome 04/18/2016   Nausea with vomiting 02/28/2008   Qualifier: Diagnosis of  By: Sherren Mocha MD, Jory Ee  Other fatigue 04/18/2016   Primary insomnia 04/18/2016   Primary osteoarthritis of both feet 04/18/2016   Primary osteoarthritis of both hands 04/18/2016   Viral URI 02/11/2011    Family History  Problem Relation Age of Onset   Hypertension Mother    Congestive Heart Failure Mother    Arthritis Mother    Rheum arthritis Maternal Grandmother    Arthritis Maternal Grandfather    Prostate cancer Maternal Grandfather     Healthy Son    Healthy Son    Breast cancer Other    Endometrial cancer Neg Hx    Ovarian cancer Neg Hx    Pancreatic cancer Neg Hx    Colon cancer Neg Hx    Past Surgical History:  Procedure Laterality Date   CESAREAN SECTION  2002   DIAGNOSTIC LAPAROSCOPY     Diagnosed with endometriosis in approximately 2000   TONSILECTOMY, ADENOIDECTOMY, BILATERAL MYRINGOTOMY AND TUBES  2014   uterine tumor removed     Social History   Social History Narrative   Not on file   Immunization History  Administered Date(s) Administered   PFIZER(Purple Top)SARS-COV-2 Vaccination 11/11/2019, 12/04/2019   Td 04/09/1999, 05/03/2010     Objective: Vital Signs: BP 112/72 (BP Location: Left Arm, Patient Position: Sitting, Cuff Size: Normal)   Pulse 76   Resp 15   Ht 5' 6.5" (1.689 m)   Wt 144 lb 3.2 oz (65.4 kg)   BMI 22.93 kg/m    Physical Exam Vitals and nursing note reviewed.  Constitutional:      Appearance: She is well-developed.  HENT:     Head: Normocephalic and atraumatic.  Eyes:     Conjunctiva/sclera: Conjunctivae normal.  Cardiovascular:     Rate and Rhythm: Normal rate and regular rhythm.     Heart sounds: Normal heart sounds.  Pulmonary:     Effort: Pulmonary effort is normal.     Breath sounds: Normal breath sounds.  Abdominal:     General: Bowel sounds are normal.     Palpations: Abdomen is soft.  Musculoskeletal:     Cervical back: Normal range of motion.  Skin:    General: Skin is warm and dry.     Capillary Refill: Capillary refill takes less than 2 seconds.  Neurological:     Mental Status: She is alert and oriented to person, place, and time.  Psychiatric:        Behavior: Behavior normal.      Musculoskeletal Exam: C-spine with thoracic spine, lumbar spine have good range of motion with no discomfort.  No midline spinal tenderness.  Shoulder joints, elbow joints, wrist joints, MCPs, PIPs, DIPs have good range of motion with no synovitis.  Some DIP  prominence consistent with osteoarthritic changes noted.  Hip joints have good range of motion with no groin pain.  Knee joints have good range of motion with no warmth or effusion.  She has tenderness over bilateral subtalar joints and in the calcaneal region.  She has tenderness over the plantar fascia insertion site of both heels.  No evidence of Achilles tendinitis.  Pes cavus noted bilaterally.  Minimal PIP and DIP thickening consistent with osteoarthritis of both feet noted.  No tenderness or synovitis over ankle joints or MTP joints.  CDAI Exam: CDAI Score: -- Patient Global: --; Provider Global: -- Swollen: --; Tender: -- Joint Exam 12/12/2021   No joint exam has been documented for this visit   There is currently no information documented on the homunculus. Go  to the Rheumatology activity and complete the homunculus joint exam.  Investigation: No additional findings.  Imaging: No results found.  Recent Labs: Lab Results  Component Value Date   WBC 3.9 (L) 08/17/2021   HGB 13.8 08/17/2021   PLT 283.0 08/17/2021   NA 139 08/17/2021   K 3.8 08/17/2021   CL 103 08/17/2021   CO2 27 08/17/2021   GLUCOSE 93 08/17/2021   BUN 11 08/17/2021   CREATININE 0.68 08/17/2021   BILITOT 0.7 08/17/2021   ALKPHOS 52 08/17/2021   AST 17 08/17/2021   ALT 14 08/17/2021   PROT 7.2 08/17/2021   ALBUMIN 4.4 08/17/2021   CALCIUM 9.6 08/17/2021   GFRAA 117 02/06/2018    Speciality Comments: No specialty comments available.  Procedures:  No procedures performed Allergies: Penicillins and Other   Assessment / Plan:     Visit Diagnoses: Myofascial pain syndrome: She is not experiencing any signs or symptoms of active myofascial pain syndrome at this time.  She has no generalized hyperalgesia or tender points on examination today.  Primary osteoarthritis of both hands: She has DIP thickening consistent with osteoarthritis of both hands.  No tenderness or inflammation was noted.  She is  able to make a complete fist bilaterally.  Discussed the importance of joint protection and muscle strengthening.  Primary osteoarthritis of both knees - XR on 08/24/18-mild OA.  She has good range of motion of both knee joints on examination.  No warmth or effusion was noted.  Primary osteoarthritis of both feet -Chronic pain. X-rays of both feet were obtained on 02/24/2019 which were consistent with mild osteoarthritis.  No erosive changes were noted.  She had updated x-rays at [facility] about 6 months ago.  She was under the care of Dr. Gershon Mussel for management of the subtalar bursitis and bone spurs.  She tried several injections with minimal improvement in her symptoms.  She has also tried formal physical therapy with no improvement in her symptoms.  She has also tried proper fitting shoes but continues to have ongoing discomfort.  She has continued home exercises as well as working with a Physiological scientist but has persistent discomfort.  She has also noticed increased instability in her ankle joints and is concerned about falling in the future.   On examination today she has tenderness along the subtalar joint bilaterally, right greater than left.  She also has some tenderness over the plantar fascia insertion site especially on the left side.  Pes cavus noted both feet.  No tenderness or synovitis over MTP joints.  No dorsal spurs noted. Dr. Estanislado Pandy also examined the patient today in the office.   A MRI of the right ankle will be obtained for further evaluation and to determine the next steps in treatment since she has failed physical therapy and cortisone injections in the past.  Other fatigue: Stable.   Other medical conditions are listed as follows:   Primary insomnia   History of anxiety  History of migraine  History of depression  Orders: No orders of the defined types were placed in this encounter.  No orders of the defined types were placed in this  encounter.     Follow-Up Instructions: Return in about 6 months (around 06/12/2022) for Myofascial pain, Osteoarthritis.   Ofilia Neas, PA-C  Note - This record has been created using Dragon software.  Chart creation errors have been sought, but may not always  have been located. Such creation errors do not reflect  on  the standard of medical care.

## 2021-12-12 ENCOUNTER — Ambulatory Visit: Payer: Federal, State, Local not specified - PPO | Attending: Physician Assistant | Admitting: Physician Assistant

## 2021-12-12 ENCOUNTER — Encounter: Payer: Self-pay | Admitting: Physician Assistant

## 2021-12-12 VITALS — BP 112/72 | HR 76 | Resp 15 | Ht 66.5 in | Wt 144.2 lb

## 2021-12-12 DIAGNOSIS — M7918 Myalgia, other site: Secondary | ICD-10-CM | POA: Diagnosis not present

## 2021-12-12 DIAGNOSIS — Z8659 Personal history of other mental and behavioral disorders: Secondary | ICD-10-CM

## 2021-12-12 DIAGNOSIS — M19042 Primary osteoarthritis, left hand: Secondary | ICD-10-CM

## 2021-12-12 DIAGNOSIS — R5383 Other fatigue: Secondary | ICD-10-CM

## 2021-12-12 DIAGNOSIS — M19071 Primary osteoarthritis, right ankle and foot: Secondary | ICD-10-CM | POA: Diagnosis not present

## 2021-12-12 DIAGNOSIS — M19041 Primary osteoarthritis, right hand: Secondary | ICD-10-CM

## 2021-12-12 DIAGNOSIS — Z8669 Personal history of other diseases of the nervous system and sense organs: Secondary | ICD-10-CM

## 2021-12-12 DIAGNOSIS — M170 Bilateral primary osteoarthritis of knee: Secondary | ICD-10-CM

## 2021-12-12 DIAGNOSIS — F5101 Primary insomnia: Secondary | ICD-10-CM

## 2021-12-12 DIAGNOSIS — M19072 Primary osteoarthritis, left ankle and foot: Secondary | ICD-10-CM

## 2021-12-13 ENCOUNTER — Other Ambulatory Visit: Payer: Self-pay | Admitting: *Deleted

## 2021-12-13 DIAGNOSIS — M25571 Pain in right ankle and joints of right foot: Secondary | ICD-10-CM

## 2021-12-21 ENCOUNTER — Ambulatory Visit (HOSPITAL_BASED_OUTPATIENT_CLINIC_OR_DEPARTMENT_OTHER)
Admission: RE | Admit: 2021-12-21 | Discharge: 2021-12-21 | Disposition: A | Discharge status: Home / Self Care | Payer: Federal, State, Local not specified - PPO | Source: Ambulatory Visit | Attending: Rheumatology | Admitting: Rheumatology

## 2021-12-21 DIAGNOSIS — R600 Localized edema: Secondary | ICD-10-CM | POA: Insufficient documentation

## 2021-12-21 DIAGNOSIS — M25571 Pain in right ankle and joints of right foot: Secondary | ICD-10-CM | POA: Diagnosis not present

## 2021-12-21 DIAGNOSIS — M25371 Other instability, right ankle: Secondary | ICD-10-CM | POA: Diagnosis not present

## 2021-12-24 DIAGNOSIS — F331 Major depressive disorder, recurrent, moderate: Secondary | ICD-10-CM | POA: Diagnosis not present

## 2021-12-24 DIAGNOSIS — G43109 Migraine with aura, not intractable, without status migrainosus: Secondary | ICD-10-CM | POA: Diagnosis not present

## 2021-12-25 ENCOUNTER — Telehealth: Payer: Self-pay | Admitting: *Deleted

## 2021-12-25 DIAGNOSIS — M25571 Pain in right ankle and joints of right foot: Secondary | ICD-10-CM

## 2021-12-25 DIAGNOSIS — M19072 Primary osteoarthritis, left ankle and foot: Secondary | ICD-10-CM

## 2021-12-25 NOTE — Progress Notes (Signed)
I discussed MRI results with the patient.  Please refer patient to Dr. Lucia Gaskins at [facility] orthopedics.

## 2021-12-25 NOTE — Telephone Encounter (Signed)
-----   Message from Bo Merino, MD sent at 12/25/2021  4:27 PM EDT ----- I discussed MRI results with the patient.  Please refer patient to Dr. Lucia Gaskins at [facility] orthopedics.

## 2021-12-28 DIAGNOSIS — M79672 Pain in left foot: Secondary | ICD-10-CM | POA: Diagnosis not present

## 2021-12-28 DIAGNOSIS — M79671 Pain in right foot: Secondary | ICD-10-CM | POA: Diagnosis not present

## 2021-12-31 ENCOUNTER — Encounter: Payer: Self-pay | Admitting: *Deleted

## 2022-01-02 DIAGNOSIS — R531 Weakness: Secondary | ICD-10-CM | POA: Diagnosis not present

## 2022-01-02 DIAGNOSIS — M79671 Pain in right foot: Secondary | ICD-10-CM | POA: Diagnosis not present

## 2022-01-02 DIAGNOSIS — M25571 Pain in right ankle and joints of right foot: Secondary | ICD-10-CM | POA: Diagnosis not present

## 2022-01-02 DIAGNOSIS — M79672 Pain in left foot: Secondary | ICD-10-CM | POA: Diagnosis not present

## 2022-01-03 DIAGNOSIS — M79671 Pain in right foot: Secondary | ICD-10-CM | POA: Diagnosis not present

## 2022-01-03 DIAGNOSIS — M79672 Pain in left foot: Secondary | ICD-10-CM | POA: Diagnosis not present

## 2022-01-03 DIAGNOSIS — M25571 Pain in right ankle and joints of right foot: Secondary | ICD-10-CM | POA: Diagnosis not present

## 2022-01-03 DIAGNOSIS — R531 Weakness: Secondary | ICD-10-CM | POA: Diagnosis not present

## 2022-01-15 DIAGNOSIS — M79672 Pain in left foot: Secondary | ICD-10-CM | POA: Diagnosis not present

## 2022-01-15 DIAGNOSIS — R531 Weakness: Secondary | ICD-10-CM | POA: Diagnosis not present

## 2022-01-15 DIAGNOSIS — M25571 Pain in right ankle and joints of right foot: Secondary | ICD-10-CM | POA: Diagnosis not present

## 2022-01-15 DIAGNOSIS — M79671 Pain in right foot: Secondary | ICD-10-CM | POA: Diagnosis not present

## 2022-03-04 DIAGNOSIS — Z1231 Encounter for screening mammogram for malignant neoplasm of breast: Secondary | ICD-10-CM | POA: Diagnosis not present

## 2022-03-06 DIAGNOSIS — F330 Major depressive disorder, recurrent, mild: Secondary | ICD-10-CM | POA: Diagnosis not present

## 2022-03-21 ENCOUNTER — Encounter: Payer: Self-pay | Admitting: *Deleted

## 2022-06-04 NOTE — Progress Notes (Signed)
Office Visit Note  Patient: Jacqueline Sloan             Date of Birth: 02/16/1966           MRN: [id number]             PCP: Leamon Arnt, MD Referring: Leamon Arnt, MD Visit Date: 06/18/2022 Occupation: '@GUAROCC'$ @  Subjective:   Pain in multiple joints  History of Present Illness: Jacqueline Sloan is a 57 y.o. female with history of osteoarthritis send myofascial pain syndrome.  She states she continues to have pain and discomfort in her bilateral feet due to plantar fasciitis.  She has had cortisone injections in the past without much help.  She states she was seen by an orthopedic surgeon and was referred to physical therapy.  Physical therapy was helpful.  She also had dry needling.  She states the physical therapy was very expensive and she has been trying to do some stretches by herself.  She is also going to report personal trainer which has been helpful.  She complains of discomfort in her bilateral trochanteric bursa.  She has off-and-on discomfort in her lower back and her bilateral wrist joints when she does exercises.  She has not noticed any joint swelling.  She continues to have some generalized pain and discomfort from fibromyalgia.    Activities of Daily Living:  Patient reports morning stiffness for 0 minute.   Patient Denies nocturnal pain.  Difficulty dressing/grooming: Denies Difficulty climbing stairs: Reports Difficulty getting out of chair: Denies Difficulty using hands for taps, buttons, cutlery, and/or writing: Reports  Review of Systems  Constitutional:  Negative for fatigue.  HENT:  Negative for mouth dryness.   Eyes:  Negative for dryness.  Respiratory:  Negative for shortness of breath.   Cardiovascular:  Negative for chest pain and palpitations.  Gastrointestinal:  Negative for blood in stool, constipation and diarrhea.  Endocrine: Negative for increased urination.  Genitourinary:  Negative for hematuria.  Musculoskeletal:  Positive for joint  pain and joint pain. Negative for myalgias, morning stiffness and myalgias.  Skin:  Negative for color change, rash and sensitivity to sunlight.  Allergic/Immunologic: Negative for susceptible to infections.  Neurological:  Negative for dizziness and headaches.  Hematological:  Negative for swollen glands.  Psychiatric/Behavioral:  Positive for depressed mood. Negative for sleep disturbance. The patient is nervous/anxious.     PMFS History:  Patient Active Problem List   Diagnosis Date Noted   Family history of pancreatic cancer 08/17/2021   Major depression, recurrent, chronic ([place]) 06/03/2017   Myofascial pain syndrome 04/18/2016   Primary osteoarthritis of both hands 04/18/2016   Primary osteoarthritis of both feet 04/18/2016   Migraine without aura or status migrainosus 04/18/2016   Primary insomnia 04/18/2016   Seasonal allergic rhinitis due to pollen 12/17/2006    Past Medical History:  Diagnosis Date   ALLERGIC RHINITIS 12/17/2006   ANXIETY 12/17/2006   Depression    GEN OSTEOARTHROSIS INVOLVING MULTIPLE SITES 12/29/2008   Major depression, recurrent, chronic ([place]) 06/03/2017   Managed by Noemi Chapel, FNP psych   Migraine    Migraine without aura or status migrainosus 04/18/2016   Preventive with lamictal   Myofascial pain syndrome 04/18/2016   Nausea with vomiting 02/28/2008   Qualifier: Diagnosis of  By: Sherren Mocha MD, Jory Ee    Other fatigue 04/18/2016   Primary insomnia 04/18/2016   Primary osteoarthritis of both feet 04/18/2016   Primary osteoarthritis of both hands 04/18/2016  Viral URI 02/11/2011    Family History  Problem Relation Age of Onset   Hypertension Mother    Congestive Heart Failure Mother    Arthritis Mother    Rheum arthritis Maternal Grandmother    Arthritis Maternal Grandfather    Prostate cancer Maternal Grandfather    Healthy Son    Healthy Son    Breast cancer Other    Endometrial cancer Neg Hx    Ovarian cancer Neg Hx    Pancreatic  cancer Neg Hx    Colon cancer Neg Hx    Past Surgical History:  Procedure Laterality Date   CESAREAN SECTION  2002   DIAGNOSTIC LAPAROSCOPY     Diagnosed with endometriosis in approximately 2000   TONSILECTOMY, ADENOIDECTOMY, BILATERAL MYRINGOTOMY AND TUBES  2014   uterine tumor removed     Social History   Social History Narrative   Not on file   Immunization History  Administered Date(s) Administered   PFIZER(Purple Top)SARS-COV-2 Vaccination 11/11/2019, 12/04/2019   Td 04/09/1999, 05/03/2010     Objective: Vital Signs: BP 99/65 (BP Location: Left Arm, Patient Position: Sitting, Cuff Size: Normal)   Pulse 65   Resp 12   Ht 5' 6.5" (1.689 m)   Wt 137 lb (62.1 kg)   BMI 21.78 kg/m    Physical Exam Vitals and nursing note reviewed.  Constitutional:      Appearance: She is well-developed.  HENT:     Head: Normocephalic and atraumatic.  Eyes:     Conjunctiva/sclera: Conjunctivae normal.  Cardiovascular:     Rate and Rhythm: Normal rate and regular rhythm.     Heart sounds: Normal heart sounds.  Pulmonary:     Effort: Pulmonary effort is normal.     Breath sounds: Normal breath sounds.  Abdominal:     General: Bowel sounds are normal.     Palpations: Abdomen is soft.  Musculoskeletal:     Cervical back: Normal range of motion.  Lymphadenopathy:     Cervical: No cervical adenopathy.  Skin:    General: Skin is warm and dry.     Capillary Refill: Capillary refill takes less than 2 seconds.  Neurological:     Mental Status: She is alert and oriented to person, place, and time.  Psychiatric:        Behavior: Behavior normal.      Musculoskeletal Exam: Cervical, thoracic and lumbar spine were in good range of motion.  She had no SI joint tenderness.  Shoulders, elbows, wrist joint, MCPs PIPs and DIPs were in good range of motion.  She had bilateral PIP and DIP thickening with no synovitis.  She had tenderness over bilateral trochanteric bursa.  Hip joints and knee  joints in good range of motion without any warmth swelling or effusion.  There was no tenderness over ankles or MTPs.  She had mild tenderness over plantar fascia.  She had hypermobility in multiple joints.  She had generalized hyperalgesia and positive tender points.  CDAI Exam: CDAI Score: -- Patient Global: --; Provider Global: -- Swollen: --; Tender: -- Joint Exam 06/18/2022   No joint exam has been documented for this visit   There is currently no information documented on the homunculus. Go to the Rheumatology activity and complete the homunculus joint exam.  Investigation: No additional findings.  Imaging: No results found.  Recent Labs: Lab Results  Component Value Date   WBC 3.9 (L) 08/17/2021   HGB 13.8 08/17/2021   PLT 283.0 08/17/2021   NA  139 08/17/2021   K 3.8 08/17/2021   CL 103 08/17/2021   CO2 27 08/17/2021   GLUCOSE 93 08/17/2021   BUN 11 08/17/2021   CREATININE 0.68 08/17/2021   BILITOT 0.7 08/17/2021   ALKPHOS 52 08/17/2021   AST 17 08/17/2021   ALT 14 08/17/2021   PROT 7.2 08/17/2021   ALBUMIN 4.4 08/17/2021   CALCIUM 9.6 08/17/2021   GFRAA 117 02/06/2018    Speciality Comments: No specialty comments available.  Procedures:  No procedures performed Allergies: Penicillins and Other   Assessment / Plan:     Visit Diagnoses: Primary osteoarthritis of both hands-she had bilateral PIP and DIP thickening with the stiffness in her hands.  Joint protection muscle strengthening was discussed.  Greater trochanteric bursitis of both hips-she continues to have tenderness over trochanteric bursa.  IT band stretches were demonstrated in the office and a handout was given.  Primary osteoarthritis of both knees-she had good range of motion without any warmth swelling or effusion.  Pain in right ankle and joints of right foot-she continues to have some popping sensation in her right ankle.  Stretching exercises and muscle strengthening exercises were  demonstrated in the office today.  Primary osteoarthritis of both feet - Diagnosed with subtalar bursitis by Dr. Gershon Mussel.  Patient was evaluated by orthopedic surgeon in the past and also went to physical therapy which was helpful.  Plantar fasciitis, bilateral-she had bilateral plantar fasciitis.  She had been doing stretching exercises which are helping.  Proper fitting shoes were advised.  A handout on exercises was given.  Hypermobility arthralgia-she has hypermobility in her joints which contributes to arthralgias.  Isometric exercises were explained and demonstrated.  Myofascial pain syndrome -she is on on gabapentin.  She continues to have generalized pain and discomfort.  But aerobics and stretching exercises including yoga were discussed.  Patient was advised to discuss possible use of Cymbalta with her psychiatrist.  Primary insomnia-good sleep hygiene was discussed.  She has noticed improvement with gabapentin.  Other fatigue-related to insomnia and fibromyalgia.  History of migraine  Anxiety and depression - on Xanax and Lamictal.  Orders: No orders of the defined types were placed in this encounter.  No orders of the defined types were placed in this encounter.    Follow-Up Instructions: Return in about 1 year (around 06/18/2023) for OA, MFPS.   Bo Merino, MD  Note - This record has been created using Editor, commissioning.  Chart creation errors have been sought, but may not always  have been located. Such creation errors do not reflect on  the standard of medical care.

## 2022-06-18 ENCOUNTER — Ambulatory Visit: Payer: Federal, State, Local not specified - PPO | Attending: Rheumatology | Admitting: Rheumatology

## 2022-06-18 ENCOUNTER — Encounter: Payer: Self-pay | Admitting: Rheumatology

## 2022-06-18 VITALS — BP 99/65 | HR 65 | Resp 12 | Ht 66.5 in | Wt 137.0 lb

## 2022-06-18 DIAGNOSIS — M170 Bilateral primary osteoarthritis of knee: Secondary | ICD-10-CM | POA: Diagnosis not present

## 2022-06-18 DIAGNOSIS — M7061 Trochanteric bursitis, right hip: Secondary | ICD-10-CM | POA: Diagnosis not present

## 2022-06-18 DIAGNOSIS — M19041 Primary osteoarthritis, right hand: Secondary | ICD-10-CM | POA: Diagnosis not present

## 2022-06-18 DIAGNOSIS — M7918 Myalgia, other site: Secondary | ICD-10-CM

## 2022-06-18 DIAGNOSIS — Z8669 Personal history of other diseases of the nervous system and sense organs: Secondary | ICD-10-CM

## 2022-06-18 DIAGNOSIS — F419 Anxiety disorder, unspecified: Secondary | ICD-10-CM

## 2022-06-18 DIAGNOSIS — M2550 Pain in unspecified joint: Secondary | ICD-10-CM

## 2022-06-18 DIAGNOSIS — M25571 Pain in right ankle and joints of right foot: Secondary | ICD-10-CM

## 2022-06-18 DIAGNOSIS — M19071 Primary osteoarthritis, right ankle and foot: Secondary | ICD-10-CM

## 2022-06-18 DIAGNOSIS — M19072 Primary osteoarthritis, left ankle and foot: Secondary | ICD-10-CM

## 2022-06-18 DIAGNOSIS — M19042 Primary osteoarthritis, left hand: Secondary | ICD-10-CM

## 2022-06-18 DIAGNOSIS — F32A Depression, unspecified: Secondary | ICD-10-CM

## 2022-06-18 DIAGNOSIS — R5383 Other fatigue: Secondary | ICD-10-CM

## 2022-06-18 DIAGNOSIS — F5101 Primary insomnia: Secondary | ICD-10-CM

## 2022-06-18 DIAGNOSIS — M722 Plantar fascial fibromatosis: Secondary | ICD-10-CM

## 2022-06-18 DIAGNOSIS — M7062 Trochanteric bursitis, left hip: Secondary | ICD-10-CM

## 2022-06-18 NOTE — Patient Instructions (Addendum)
Plantar Fasciitis Rehab Ask your health care provider which exercises are safe for you. Do exercises exactly as told by your health care provider and adjust them as directed. It is normal to feel mild stretching, pulling, tightness, or discomfort as you do these exercises. Stop right away if you feel sudden pain or your pain gets worse. Do not begin these exercises until told by your health care provider. Stretching and range-of-motion exercises These exercises warm up your muscles and joints and improve the movement and flexibility of your foot. These exercises also help to relieve pain. Plantar fascia stretch  Sit with your left / right leg crossed over your opposite knee. Hold your heel with one hand with that thumb near your arch. With your other hand, hold your toes and gently pull them back toward the top of your foot. You should feel a stretch on the base (bottom) of your toes, or the bottom of your foot (plantar fascia), or both. Hold this stretch for__________ seconds. Slowly release your toes and return to the starting position. Repeat __________ times. Complete this exercise __________ times a day. Gastrocnemius stretch, standing This exercise is also called a calf (gastroc) stretch. It stretches the muscles in the back of the upper calf. Stand with your hands against a wall. Extend your left / right leg behind you, and bend your front knee slightly. Keeping your heels on the floor, your toes facing forward, and your back knee straight, shift your weight toward the wall. Do not arch your back. You should feel a gentle stretch in your upper calf. Hold this position for __________ seconds. Repeat __________ times. Complete this exercise __________ times a day. Soleus stretch, standing This exercise is also called a calf (soleus) stretch. It stretches the muscles in the back of the lower calf. Stand with your hands against a wall. Extend your left / right leg behind you, and bend your  front knee slightly. Keeping your heels on the floor and your toes facing forward, bend your back knee and shift your weight slightly over your back leg. You should feel a gentle stretch deep in your lower calf. Hold this position for __________ seconds. Repeat __________ times. Complete this exercise __________ times a day. Gastroc and soleus stretch, standing step This exercise stretches the muscles in the back of the lower leg. These muscles are in the upper calf (gastrocnemius) and the lower calf (soleus). Stand with the ball of your left / right foot on the front of a step. The ball of your foot is on the walking surface, right under your toes. Keep your other foot firmly on the same step. Hold on to the wall or a railing for balance. Slowly lift your other foot, allowing your body weight to press your heel down over the edge of the front of the step. Keep knee straight and unbent. You should feel a stretch in your calf. Hold this position for __________ seconds. Return both feet to the step. Repeat this exercise with a slight bend in your left / right knee. Repeat __________ times with your left / right knee straight and __________ times with your left / right knee bent. Complete this exercise __________ times a day. Balance exercise This exercise builds your balance and strength control of your arch to help take pressure off your plantar fascia. Single leg stand If this exercise is too easy, you can try it with your eyes closed or while standing on a pillow. Without shoes, stand near a  railing or in a doorway. You may hold on to the railing or door frame as needed. Stand on your left / right foot. Keep your big toe down on the floor and lift the arch of your foot. You should feel a stretch across the bottom of your foot and your arch. Do not let your foot roll inward. Hold this position for __________ seconds. Repeat __________ times. Complete this exercise __________ times a day. This  information is not intended to replace advice given to you by your health care provider. Make sure you discuss any questions you have with your health care provider. Document Revised: 01/06/2020 Document Reviewed: 01/06/2020 Elsevier Patient Education  [place]owner Band Syndrome Rehab Ask your health care provider which exercises are safe for you. Do exercises exactly as told by your health care provider and adjust them as directed. It is normal to feel mild stretching, pulling, tightness, or discomfort as you do these exercises. Stop right away if you feel sudden pain or your pain gets significantly worse. Do not begin these exercises until told by your health care provider. Stretching and range-of-motion exercises These exercises warm up your muscles and joints and improve the movement and flexibility of your hip and pelvis. Quadriceps stretch, prone  Lie on your abdomen (prone position) on a firm surface, such as a bed or padded floor. Bend your left / right knee and reach back to hold your ankle or pant leg. If you cannot reach your ankle or pant leg, loop a belt around your foot and grab the belt instead. Gently pull your heel toward your buttocks. Your knee should not slide out to the side. You should feel a stretch in the front of your thigh and knee (quadriceps). Hold this position for __________ seconds. Repeat __________ times. Complete this exercise __________ times a day. Iliotibial band stretch An iliotibial band is a strong band of muscle tissue that runs from the outer side of your hip to the outer side of your thigh and knee. Lie on your side with your left / right leg in the top position. Bend both of your knees and grab your left / right ankle. Stretch out your bottom arm to help you balance. Slowly bring your top knee back so your thigh goes behind your trunk. Slowly lower your top leg toward the floor until you feel a gentle stretch on the outside of your left  / right hip and thigh. If you do not feel a stretch and your knee will not fall farther, place the heel of your other foot on top of your knee and pull your knee down toward the floor with your foot. Hold this position for __________ seconds. Repeat __________ times. Complete this exercise __________ times a day. Strengthening exercises These exercises build strength and endurance in your hip and pelvis. Endurance is the ability to use your muscles for a long time, even after they get tired. Straight leg raises, side-lying This exercise strengthens the muscles that rotate the leg at the hip and move it away from your body (hip abductors). Lie on your side with your left / right leg in the top position. Lie so your head, shoulder, hip, and knee line up. You may bend your bottom knee to help you balance. Roll your hips slightly forward so your hips are stacked directly over each other and your left / right knee is facing forward. Tense the muscles in your outer thigh and lift your top leg 4-6  inches (10-15 cm). Hold this position for __________ seconds. Slowly lower your leg to return to the starting position. Let your muscles relax completely before doing another repetition. Repeat __________ times. Complete this exercise __________ times a day. Leg raises, prone This exercise strengthens the muscles that move the hips backward (hip extensors). Lie on your abdomen (prone position) on your bed or a firm surface. You can put a pillow under your hips if that is more comfortable for your lower back. Bend your left / right knee so your foot is straight up in the air. Squeeze your buttocks muscles and lift your left / right thigh off the bed. Do not let your back arch. Tense your thigh muscle as hard as you can without increasing any knee pain. Hold this position for __________ seconds. Slowly lower your leg to return to the starting position and allow it to relax completely. Repeat __________ times.  Complete this exercise __________ times a day. Hip hike Stand sideways on a bottom step. Stand on your left / right leg with your other foot unsupported next to the step. You can hold on to a railing or wall for balance if needed. Keep your knees straight and your torso square. Then lift your left / right hip up toward the ceiling. Slowly let your left / right hip lower toward the floor, past the starting position. Your foot should get closer to the floor. Do not lean or bend your knees. Repeat __________ times. Complete this exercise __________ times a day. This information is not intended to replace advice given to you by your health care provider. Make sure you discuss any questions you have with your health care provider. Document Revised: 06/02/2019 Document Reviewed: 06/02/2019 Elsevier Patient Education  [place].

## 2022-08-20 ENCOUNTER — Encounter: Payer: Federal, State, Local not specified - PPO | Admitting: Family Medicine

## 2022-09-11 ENCOUNTER — Ambulatory Visit (INDEPENDENT_AMBULATORY_CARE_PROVIDER_SITE_OTHER): Payer: Federal, State, Local not specified - PPO | Admitting: Family Medicine

## 2022-09-11 ENCOUNTER — Encounter: Payer: Self-pay | Admitting: Family Medicine

## 2022-09-11 VITALS — BP 104/70 | HR 78 | Temp 98.3°F | Ht 66.5 in | Wt 136.4 lb

## 2022-09-11 DIAGNOSIS — Z23 Encounter for immunization: Secondary | ICD-10-CM

## 2022-09-11 DIAGNOSIS — F5101 Primary insomnia: Secondary | ICD-10-CM

## 2022-09-11 DIAGNOSIS — M722 Plantar fascial fibromatosis: Secondary | ICD-10-CM

## 2022-09-11 DIAGNOSIS — F339 Major depressive disorder, recurrent, unspecified: Secondary | ICD-10-CM | POA: Diagnosis not present

## 2022-09-11 DIAGNOSIS — Z0000 Encounter for general adult medical examination without abnormal findings: Secondary | ICD-10-CM

## 2022-09-11 DIAGNOSIS — M7918 Myalgia, other site: Secondary | ICD-10-CM | POA: Diagnosis not present

## 2022-09-11 DIAGNOSIS — Z800 Family history of malignant neoplasm of digestive organs: Secondary | ICD-10-CM

## 2022-09-11 DIAGNOSIS — Z1231 Encounter for screening mammogram for malignant neoplasm of breast: Secondary | ICD-10-CM

## 2022-09-11 LAB — COMPREHENSIVE METABOLIC PANEL
ALT: 12 U/L (ref 0–35)
AST: 19 U/L (ref 0–37)
Albumin: 4.6 g/dL (ref 3.5–5.2)
Alkaline Phosphatase: 73 U/L (ref 39–117)
BUN: 12 mg/dL (ref 6–23)
CO2: 24 mEq/L (ref 19–32)
Calcium: 9.7 mg/dL (ref 8.4–10.5)
Chloride: 103 mEq/L (ref 96–112)
Creatinine, Ser: 0.67 mg/dL (ref 0.40–1.20)
GFR: 98.01 mL/min (ref >60.00)
Glucose, Bld: 89 mg/dL (ref 70–99)
Potassium: 3.9 mEq/L (ref 3.5–5.1)
Sodium: 141 mEq/L (ref 135–145)
Total Bilirubin: 0.7 mg/dL (ref 0.2–1.2)
Total Protein: 7.2 g/dL (ref 6.0–8.3)

## 2022-09-11 LAB — LIPID PANEL
Cholesterol: 243 mg/dL — ABNORMAL HIGH (ref 0–200)
HDL: 62.6 mg/dL (ref >39.00)
LDL Cholesterol: 161 mg/dL — ABNORMAL HIGH (ref 0–99)
NonHDL: 180.28
Total CHOL/HDL Ratio: 4
Triglycerides: 96 mg/dL (ref 0.0–149.0)
VLDL: 19.2 mg/dL (ref 0.0–40.0)

## 2022-09-11 LAB — CBC WITH DIFFERENTIAL/PLATELET
Basophils Absolute: 0 10*3/uL (ref 0.0–0.1)
Basophils Relative: 1 % (ref 0.0–3.0)
Eosinophils Absolute: 0.2 10*3/uL (ref 0.0–0.7)
Eosinophils Relative: 3.7 % (ref 0.0–5.0)
HCT: 41.7 % (ref 36.0–46.0)
Hemoglobin: 13.8 g/dL (ref 12.0–15.0)
Lymphocytes Relative: 50.7 % — ABNORMAL HIGH (ref 12.0–46.0)
Lymphs Abs: 2.1 10*3/uL (ref 0.7–4.0)
MCHC: 33.2 g/dL (ref 30.0–36.0)
MCV: 95.1 fl (ref 78.0–100.0)
Monocytes Absolute: 0.5 10*3/uL (ref 0.1–1.0)
Monocytes Relative: 11 % (ref 3.0–12.0)
Neutro Abs: 1.4 10*3/uL (ref 1.4–7.7)
Neutrophils Relative %: 33.6 % — ABNORMAL LOW (ref 43.0–77.0)
Platelets: 292 10*3/uL (ref 150.0–400.0)
RBC: 4.38 Mil/uL (ref 3.87–5.11)
RDW: 13.1 % (ref 11.5–15.5)
WBC: 4.1 10*3/uL (ref 4.0–10.5)

## 2022-09-11 LAB — TSH: TSH: 1.49 u[IU]/mL (ref 0.35–5.50)

## 2022-09-11 NOTE — Progress Notes (Signed)
Subjective  Chief Complaint  Patient presents with   Annual Exam    Pt here for Annual Exam and is not currently fasting     HPI: Jacqueline Sloan is a 57 y.o. female who presents to [facility] [place] today for a Female Wellness Visit. She also has the concerns and/or needs as listed above in the chief complaint. These will be addressed in addition to the Health Maintenance Visit.   Wellness Visit: annual visit with health maintenance review and exam without Pap  HM: due mammo.  She prefers to get this done at [facility] GYN office, [facility].  Eligible for shingrix and tdap: She declines Shingrix and COVID vaccinations.  Education given.  Accepts Tdap today.  Healthy lifestyle.  Eating well, exercises at [facility] well.  Eye exam current.     09/11/2022    9:34 AM 06/03/2017    2:12 PM  Depression screen PHQ 2/9  Decreased Interest 2 1  Down, Depressed, Hopeless 2 1  PHQ - 2 Score 4 2  Altered sleeping 2 0  Tired, decreased energy 2 1  Change in appetite 2 0  Feeling bad or failure about yourself  1 0  Trouble concentrating 1 1  Moving slowly or fidgety/restless 1 0  Suicidal thoughts 0 0  PHQ-9 Score 13 4  Difficult doing work/chores Somewhat difficult     Chronic disease f/u and/or acute problem visit: (deemed necessary to be done in addition to the wellness visit): Major depression managed by Ocie Bob, fnp.  On Lamictal.  Mood is slightly worse in part due to being very busy, chronic pain syndrome and life factors like social climate, clinical climate etc.  She says she is stable. Insomnia and postmenopausal night sweats: Now managed with gabapentin.  See dose below. Sees rheumatology for myofascial pain syndrome.  Gabapentin is helpful.  Exercise and healthy lifestyle are also helpful. Still dealing with plantar fasciitis: Has seen orthopedics.  Has had several treatments.  Outpatient Encounter Medications as of 09/11/2022  Medication Sig   ALPRAZolam  (XANAX) 0.25 MG tablet Take 0.25 mg by mouth daily as needed.   cycloSPORINE (RESTASIS) 0.05 % ophthalmic emulsion as needed.   gabapentin (NEURONTIN) 100 MG capsule Take 200 mg by mouth at bedtime.   lamoTRIgine (LAMICTAL) 100 MG tablet Take by mouth.   No facility-administered encounter medications on file as of 09/11/2022.    Assessment  1. Annual physical exam   2. Need for Tdap vaccination   3. Major depression, recurrent, chronic (HCC)   4. Myofascial pain syndrome   5. Primary insomnia   6. Family history of pancreatic cancer   7. Screening mammogram for breast cancer   8. Plantar fasciitis of right foot      Plan  Female Wellness Visit: Age appropriate Health Maintenance and Prevention measures were discussed with patient. Included topics are cancer screening recommendations, ways to keep healthy (see AVS) including dietary and exercise recommendations, regular eye and dental care, use of seat belts, and avoidance of moderate alcohol use and tobacco use. Pt to call gyn to schedule overdue mammogram BMI: discussed patient's BMI and encouraged positive lifestyle modifications to help get to or maintain a target BMI. HM needs and immunizations were addressed and ordered. See below for orders. See HM and immunization section for updates.Tdap today Routine labs and screening tests ordered including cmp, cbc and lipids where appropriate. Discussed recommendations regarding Vit D and calcium supplementation (see AVS)  Chronic  disease management visit and/or acute problem visit: Fibromyalgia: per rheum. Educated on sleep and execise. Pt does well with these. No pain meds Depression: working with psych. On lamitctal and gabapentin.  Postmenopausal hot flushes: mild per patient. Can consider titrating up dose of gabapentin. Continue exercises for plantar fasciitis. Gabapentin held for primary insomnia.  Outpatient Encounter Medications as of 09/11/2022  Medication Sig   ALPRAZolam  (XANAX) 0.25 MG tablet Take 0.25 mg by mouth daily as needed.   cycloSPORINE (RESTASIS) 0.05 % ophthalmic emulsion as needed.   gabapentin (NEURONTIN) 100 MG capsule Take 200 mg by mouth at bedtime.   lamoTRIgine (LAMICTAL) 100 MG tablet Take by mouth.   No facility-administered encounter medications on file as of 09/11/2022.    Follow up: 12 mo for cpe  Orders Placed This Encounter  Procedures   Tdap vaccine greater than or equal to 7yo IM   CBC with Differential/Platelet   Comp Met (CMET)   Lipid Profile   TSH   No orders of the defined types were placed in this encounter.     Body mass index is 21.69 kg/m. Wt Readings from Last 3 Encounters:  09/11/22 136 lb 6.4 oz (61.9 kg)  06/18/22 137 lb (62.1 kg)  12/12/21 144 lb 3.2 oz (65.4 kg)     Patient Active Problem List   Diagnosis Date Noted   Major depression, recurrent, chronic (HCC) 06/03/2017    Priority: High    Managed by Ellis Savage, FNP psych    Myofascial pain syndrome 04/18/2016    Priority: High   Primary osteoarthritis of both hands 04/18/2016    Priority: Medium    Primary osteoarthritis of both feet 04/18/2016    Priority: Medium    Migraine without aura or status migrainosus 04/18/2016    Priority: Medium     Preventive with lamictal    Primary insomnia 04/18/2016    Priority: Medium    Seasonal allergic rhinitis due to pollen 12/17/2006    Priority: Low   Family history of pancreatic cancer 08/17/2021   Health Maintenance  Topic Date Due   MAMMOGRAM  04/22/2022   INFLUENZA VACCINE  11/07/2022   Colonoscopy  03/02/2025   PAP SMEAR-Modifier  04/12/2026   DTaP/Tdap/Td (4 - Td or Tdap) 09/10/2032   Hepatitis C Screening  Completed   HIV Screening  Completed   HPV VACCINES  Aged Out   COVID-19 Vaccine  Discontinued   Zoster Vaccines- Shingrix  Discontinued   Immunization History  Administered Date(s) Administered   PFIZER(Purple Top)SARS-COV-2 Vaccination 11/11/2019, 12/04/2019   Td  04/09/1999, 05/03/2010   Tdap 09/11/2022   We updated and reviewed the patient's past history in detail and it is documented below. Allergies: Patient is allergic to penicillins and other. Past Medical History Patient  has a past medical history of ALLERGIC RHINITIS (12/17/2006), ANXIETY (12/17/2006), Depression, GEN OSTEOARTHROSIS INVOLVING MULTIPLE SITES (12/29/2008), Major depression, recurrent, chronic (HCC) (06/03/2017), Migraine, Migraine without aura or status migrainosus (04/18/2016), Myofascial pain syndrome (04/18/2016), Nausea with vomiting (02/28/2008), Other fatigue (04/18/2016), Primary insomnia (04/18/2016), Primary osteoarthritis of both feet (04/18/2016), Primary osteoarthritis of both hands (04/18/2016), and Viral URI (02/11/2011). Past Surgical History Patient  has a past surgical history that includes Cesarean section (2002); Tonsilectomy, adenoidectomy, bilateral myringotomy and tubes (2014); Diagnostic laparoscopy; and uterine tumor removed. Family History: Patient family history includes Arthritis in her maternal grandfather and mother; Breast cancer in an other family member; Congestive Heart Failure in her mother; Healthy in her son and son;  Hypertension in her mother; Prostate cancer in her maternal grandfather; Rheum arthritis in her maternal grandmother. Social History:  Patient  reports that she has never smoked. She has never been exposed to tobacco smoke. She has never used smokeless tobacco. She reports current alcohol use. She reports that she does not use drugs.  Review of Systems: Constitutional: negative for fever or malaise Ophthalmic: negative for photophobia, double vision or loss of vision Cardiovascular: negative for chest pain, dyspnea on exertion, or new LE swelling Respiratory: negative for SOB or persistent cough Gastrointestinal: negative for abdominal pain, change in bowel habits or melena Genitourinary: negative for dysuria or gross hematuria, no  abnormal uterine bleeding or disharge Musculoskeletal: negative for new gait disturbance or muscular weakness Integumentary: negative for new or persistent rashes, no breast lumps Neurological: negative for TIA or stroke symptoms Psychiatric: negative for SI or delusions Allergic/Immunologic: negative for hives  Patient Care Team    Relationship Specialty Notifications Start End  Willow Ora, MD PCP - General Family Medicine  06/03/17   Charna Elizabeth, MD Consulting Physician Gastroenterology  08/17/21   [facility] Consulting Physician Obstetrics and Gynecology  09/11/22   Pollyann Savoy, MD Consulting Physician Rheumatology  09/11/22     Objective  Vitals: BP 104/70   Pulse 78   Temp 98.3 F (36.8 C)   Ht 5' 6.5" (1.689 m)   Wt 136 lb 6.4 oz (61.9 kg)   SpO2 95%   BMI 21.69 kg/m  General:  Well developed, well nourished, no acute distress  Psych:  Alert and orientedx3,normal mood and affect HEENT:  Normocephalic, atraumatic, non-icteric sclera,  supple neck without adenopathy, mass or thyromegaly Cardiovascular:  Normal S1, S2, RRR without gallop, rub or murmur Respiratory:  Good breath sounds bilaterally, CTAB with normal respiratory effort Gastrointestinal: normal bowel sounds, soft, non-tender, no noted masses. No HSM MSK: extremities without edema, joints without erythema or swelling Neurologic:    Mental status is normal.  Gross motor and sensory exams are normal.  No tremor  Commons side effects, risks, benefits, and alternatives for medications and treatment plan prescribed today were discussed, and the patient expressed understanding of the given instructions. Patient is instructed to call or message via MyChart if he/she has any questions or concerns regarding our treatment plan. No barriers to understanding were identified. We discussed Red Flag symptoms and signs in detail. Patient expressed understanding regarding what to do in case of urgent or emergency type  symptoms.  Medication list was reconciled, printed and provided to the patient in AVS. Patient instructions and summary information was reviewed with the patient as documented in the AVS. This note was prepared with assistance of Dragon voice recognition software. Occasional wrong-word or sound-a-like substitutions may have occurred due to the inherent limitations of voice recognition software

## 2022-09-11 NOTE — Patient Instructions (Addendum)
Please return in 12 months for your annual complete physical; please come fasting.   I will release your lab results to you on your MyChart account with further instructions. You may see the results before I do, but when I review them I will send you a message with my report or have my assistant call you if things need to be discussed. Please reply to my message with any questions. Thank you!   Please call you GYN office to schedule your GYN visit and mammogram.  Please ask them to send me results.  If you have any questions or concerns, please don't hesitate to send me a message via MyChart or call the office at [phone]. Thank you for visiting with [place] today! It's our pleasure caring for you.   Please do these things to maintain good health!  Exercise at least 30-45 minutes a day,  4-5 days a week.  Eat a low-fat diet with lots of fruits and vegetables, up to 7-9 servings per day. Drink plenty of water daily. Try to drink 8 8oz glasses per day. Seatbelts can save your life. Always wear your seatbelt. Place Smoke Detectors on every level of your home and check batteries every year. Schedule an appointment with an eye doctor for an eye exam every 1-2 years Safe sex - use condoms to protect yourself from STDs if you could be exposed to these types of infections. Use birth control if you do not want to become pregnant and are sexually active. Avoid heavy alcohol use. If you drink, keep it to less than 2 drinks/day and not every day. Health Care Power of Attorney.  Choose someone you trust that could speak for you if you became unable to speak for yourself. Depression is common in our stressful world.If you're feeling down or losing interest in things you normally enjoy, please come in for a visit. If anyone is threatening or hurting you, please get help. Physical or Emotional Violence is never OK.

## 2022-09-12 NOTE — Progress Notes (Signed)
Labs reviewed.  The 10-year ASCVD risk score (Arnett DK, et al., 2019) is: 1.4%   Values used to calculate the score:     Age: 57 years     Sex: Female     Is Non-Hispanic African American: No     Diabetic: No     Tobacco smoker: No     Systolic Blood Pressure: 104 mmHg     Is BP treated: No     HDL Cholesterol: 62.6 mg/dL     Total Cholesterol: 243 mg/dL

## 2022-11-11 DIAGNOSIS — G43109 Migraine with aura, not intractable, without status migrainosus: Secondary | ICD-10-CM | POA: Diagnosis not present

## 2022-11-11 DIAGNOSIS — F419 Anxiety disorder, unspecified: Secondary | ICD-10-CM | POA: Diagnosis not present

## 2022-11-26 DIAGNOSIS — H40013 Open angle with borderline findings, low risk, bilateral: Secondary | ICD-10-CM | POA: Diagnosis not present

## 2023-01-16 DIAGNOSIS — M5134 Other intervertebral disc degeneration, thoracic region: Secondary | ICD-10-CM | POA: Diagnosis not present

## 2023-01-16 DIAGNOSIS — Z6821 Body mass index (BMI) 21.0-21.9, adult: Secondary | ICD-10-CM | POA: Diagnosis not present

## 2023-01-16 DIAGNOSIS — M25552 Pain in left hip: Secondary | ICD-10-CM | POA: Diagnosis not present

## 2023-01-16 DIAGNOSIS — M545 Low back pain, unspecified: Secondary | ICD-10-CM | POA: Diagnosis not present

## 2023-01-16 DIAGNOSIS — Z0130 Encounter for examination of blood pressure without abnormal findings: Secondary | ICD-10-CM | POA: Diagnosis not present

## 2023-01-19 ENCOUNTER — Other Ambulatory Visit: Payer: Self-pay | Admitting: Physician Assistant

## 2023-01-19 ENCOUNTER — Encounter (HOSPITAL_BASED_OUTPATIENT_CLINIC_OR_DEPARTMENT_OTHER): Payer: Self-pay | Admitting: Emergency Medicine

## 2023-01-19 ENCOUNTER — Emergency Department (HOSPITAL_BASED_OUTPATIENT_CLINIC_OR_DEPARTMENT_OTHER)
Admission: EM | Admit: 2023-01-19 | Discharge: 2023-01-19 | Disposition: A | Discharge status: Home / Self Care | Payer: Federal, State, Local not specified - PPO | Attending: Emergency Medicine | Admitting: Emergency Medicine

## 2023-01-19 ENCOUNTER — Emergency Department (HOSPITAL_BASED_OUTPATIENT_CLINIC_OR_DEPARTMENT_OTHER): Payer: Federal, State, Local not specified - PPO

## 2023-01-19 DIAGNOSIS — M25552 Pain in left hip: Secondary | ICD-10-CM | POA: Insufficient documentation

## 2023-01-19 HISTORY — DX: Unspecified osteoarthritis, unspecified site: M19.90

## 2023-01-19 MED ORDER — OXYCODONE HCL 5 MG PO TABS
5.0000 mg | ORAL_TABLET | Freq: Once | ORAL | Status: AC
  Administered 2023-01-19: 5 mg via ORAL
  Filled 2023-01-19: qty 1

## 2023-01-19 MED ORDER — PREDNISONE 5 MG PO TABS: ORAL_TABLET | ORAL | 0 refills | Status: DC

## 2023-01-19 NOTE — ED Triage Notes (Signed)
Left hip front back hurting,started on Wednesday, going down the left leg. Pt was prescribed mobic and muscle relaxer with no relief

## 2023-01-19 NOTE — ED Triage Notes (Signed)
Pt just started prednisone today from rheumatologist.

## 2023-01-19 NOTE — ED Notes (Signed)
Discharge paperwork given and verbally understood.

## 2023-01-19 NOTE — Discharge Instructions (Addendum)
At this time I have a concern that your left-sided hip pain is likely secondary to iliopsoas muscle pain versus quadriceps tendinitis at the attachment of the ASIS.  Please follow-up with your appointment with orthopedics first thing tomorrow morning for further recommendations.  Medication given for pain control today.  Crutches supplied.

## 2023-01-20 ENCOUNTER — Telehealth: Payer: Self-pay | Admitting: Physician Assistant

## 2023-01-20 DIAGNOSIS — M7062 Trochanteric bursitis, left hip: Secondary | ICD-10-CM | POA: Diagnosis not present

## 2023-01-20 DIAGNOSIS — M25552 Pain in left hip: Secondary | ICD-10-CM | POA: Diagnosis not present

## 2023-01-20 NOTE — Telephone Encounter (Signed)
I received a message from the on-call service on 01/19/23 and returned the patients call for more details.  According to the patient she had been experiencing left-sided hip pain for the past 4 days.  No injury or fall.   She was evaluated at urgent care on saturday since she was having difficulty ambulating.  She had x-rays of the lumbar spine and left hip for further evaluation.  According to the patient she was told that her osteoarthritis had progressed.  She was given a prescription for meloxicam and methocarbamol but her symptoms have not improved.  Patient states that she had tried some leftover prednisone that she had had at home Sunday morning (20 mg) which had improved her symptoms slightly. Patient was having difficult time performing ADLs due to severity of pain.  She is scheduled [facility] at [facility] for further evaluation and treatment.  Due to the severity of symptoms a prednisone taper starting at 20 mg tapering by 5 mg every 4 days was sent to the pharmacy yesterday.  Instructions were provided.  Patient was advised to call our office this week if her symptoms persist or worsen.  Sherron Ales, PA-C

## 2023-01-21 DIAGNOSIS — D496 Neoplasm of unspecified behavior of brain: Secondary | ICD-10-CM | POA: Diagnosis not present

## 2023-01-21 NOTE — ED Provider Notes (Signed)
[facility] AT [facility] Provider Note   CSN: [id number] Arrival date & time: 01/19/23  1647     History  No chief complaint on file.   Jacqueline Sloan is a 57 y.o. female.  Patient is a 57 yo female presenting for left sided hip pain. She admits to worsening pain over the last 3-4 days. She states prior to pain onset she was lifting a fridge at work. Pain is located at her left hip bone and radiates to her left proximal anterior medial thigh. She denies any external wounds, rashes, or bruising. She has no motor deficits but has extreme pain with extension of the left lower extremity at the hip. She was seen at urgent care and had xray showing OA. She was given steroids.   The history is provided by the patient. No language interpreter was used.       Home Medications Prior to Admission medications   Medication Sig Start Date End Date Taking? Authorizing Provider  ALPRAZolam (XANAX) 0.25 MG tablet Take 0.25 mg by mouth daily as needed. 05/12/21   [provider]  cycloSPORINE (RESTASIS) 0.05 % ophthalmic emulsion as needed. 06/28/13   [provider]  gabapentin (NEURONTIN) 100 MG capsule Take 200 mg by mouth at bedtime. 05/12/21   [provider]  lamoTRIgine (LAMICTAL) 100 MG tablet Take by mouth. 05/28/21   [provider]  predniSONE (DELTASONE) 5 MG tablet Take 4 tablets by mouth daily x4 days, 3 tablets daily x4 days, 2 tablets daily x4 days, 1 tablet daily x4 days. 01/19/23   Gearldine Bienenstock, PA-C      Allergies    Other    Review of Systems   Review of Systems  Constitutional:  Negative for chills and fever.  HENT:  Negative for ear pain and sore throat.   Eyes:  Negative for pain and visual disturbance.  Respiratory:  Negative for cough and shortness of breath.   Cardiovascular:  Negative for chest pain and palpitations.  Gastrointestinal:  Negative for abdominal pain and vomiting.  Genitourinary:   Negative for dysuria and hematuria.  Musculoskeletal:  Positive for gait problem. Negative for arthralgias and back pain.       Left hip/thigh pain   Skin:  Negative for color change and rash.  Neurological:  Negative for seizures and syncope.  All other systems reviewed and are negative.   Physical Exam Updated Vital Signs BP 134/80 (BP Location: Right Arm)   Pulse 81   Temp 98 F (36.7 C)   Resp 18   LMP 08/17/2018   SpO2 99%  Physical Exam Vitals and nursing note reviewed.  Constitutional:      General: She is not in acute distress.    Appearance: She is well-developed.  HENT:     Head: Normocephalic and atraumatic.  Eyes:     Conjunctiva/sclera: Conjunctivae normal.  Cardiovascular:     Rate and Rhythm: Normal rate and regular rhythm.     Pulses:          Dorsalis pedis pulses are 2+ on the left side.     Heart sounds: No murmur heard. Pulmonary:     Effort: Pulmonary effort is normal. No respiratory distress.     Breath sounds: Normal breath sounds.  Abdominal:     Palpations: Abdomen is soft.     Tenderness: There is no abdominal tenderness.  Musculoskeletal:        General: No swelling.  Cervical back: Neck supple.       Legs:  Skin:    General: Skin is warm and dry.     Capillary Refill: Capillary refill takes less than 2 seconds.  Neurological:     Mental Status: She is alert.     Sensory: Sensation is intact.     Motor: Motor function is intact.  Psychiatric:        Mood and Affect: Mood normal.     ED Results / Procedures / Treatments   Labs (all labs ordered are listed, but only abnormal results are displayed) Labs Reviewed - No data to display  EKG None  Radiology No results found.  Procedures Procedures    Medications Ordered in ED Medications  oxyCODONE (Oxy IR/ROXICODONE) immediate release tablet 5 mg (5 mg Oral Given 01/19/23 2042)    ED Course/ Medical Decision Making/ A&P                                 Medical  Decision Making Risk Prescription drug management.   57 yo female presenting for hip pain. Neurovascularly intact with soft compartments. No bony tenderness. Pain just inferior to ASIS where the quadriceps attach. Full range for motion with passive movement. Pain with active ROM. Worse with extension of the hip.No skin rashes,swelling, or abscesses. Concern for tendinis possibly psoas syndrome given pain radiation pattern. Already on steroids. Recommend follow up with ortho. Crutches given due to difficultly ambulating. Medication given for pain control.   Patient in no distress and overall condition improved here in the ED. Detailed discussions were had with the patient regarding current findings, and need for close f/u with PCP or on call doctor. The patient has been instructed to return immediately if the symptoms worsen in any way for re-evaluation. Patient verbalized understanding and is in agreement with current care plan. All questions answered prior to discharge.         Final Clinical Impression(s) / ED Diagnoses Final diagnoses:  Left hip pain    Rx / DC Orders ED Discharge Orders     None         Franne Forts, DO 01/21/23 1345

## 2023-01-23 DIAGNOSIS — E109 Type 1 diabetes mellitus without complications: Secondary | ICD-10-CM | POA: Diagnosis not present

## 2023-01-23 DIAGNOSIS — I10 Essential (primary) hypertension: Secondary | ICD-10-CM | POA: Diagnosis not present

## 2023-01-23 DIAGNOSIS — Z87891 Personal history of nicotine dependence: Secondary | ICD-10-CM | POA: Diagnosis not present

## 2023-01-23 DIAGNOSIS — D496 Neoplasm of unspecified behavior of brain: Secondary | ICD-10-CM | POA: Diagnosis not present

## 2023-01-23 DIAGNOSIS — Z01818 Encounter for other preprocedural examination: Secondary | ICD-10-CM | POA: Diagnosis not present

## 2023-01-27 DIAGNOSIS — Z794 Long term (current) use of insulin: Secondary | ICD-10-CM | POA: Diagnosis not present

## 2023-01-27 DIAGNOSIS — D496 Neoplasm of unspecified behavior of brain: Secondary | ICD-10-CM | POA: Diagnosis not present

## 2023-01-27 DIAGNOSIS — F419 Anxiety disorder, unspecified: Secondary | ICD-10-CM | POA: Diagnosis not present

## 2023-01-27 DIAGNOSIS — H409 Unspecified glaucoma: Secondary | ICD-10-CM | POA: Diagnosis not present

## 2023-01-27 DIAGNOSIS — F909 Attention-deficit hyperactivity disorder, unspecified type: Secondary | ICD-10-CM | POA: Diagnosis not present

## 2023-01-27 DIAGNOSIS — H02402 Unspecified ptosis of left eyelid: Secondary | ICD-10-CM | POA: Diagnosis not present

## 2023-01-27 DIAGNOSIS — T473X5A Adverse effect of saline and osmotic laxatives, initial encounter: Secondary | ICD-10-CM | POA: Diagnosis not present

## 2023-01-27 DIAGNOSIS — E871 Hypo-osmolality and hyponatremia: Secondary | ICD-10-CM | POA: Diagnosis not present

## 2023-01-27 DIAGNOSIS — Z888 Allergy status to other drugs, medicaments and biological substances status: Secondary | ICD-10-CM | POA: Diagnosis not present

## 2023-01-27 DIAGNOSIS — C712 Malignant neoplasm of temporal lobe: Secondary | ICD-10-CM | POA: Diagnosis not present

## 2023-01-27 DIAGNOSIS — E109 Type 1 diabetes mellitus without complications: Secondary | ICD-10-CM | POA: Diagnosis not present

## 2023-01-27 DIAGNOSIS — C719 Malignant neoplasm of brain, unspecified: Secondary | ICD-10-CM | POA: Diagnosis not present

## 2023-01-27 DIAGNOSIS — G8918 Other acute postprocedural pain: Secondary | ICD-10-CM | POA: Diagnosis not present

## 2023-01-28 DIAGNOSIS — C719 Malignant neoplasm of brain, unspecified: Secondary | ICD-10-CM | POA: Diagnosis not present

## 2023-02-02 ENCOUNTER — Telehealth: Payer: Self-pay | Admitting: Rheumatology

## 2023-02-02 NOTE — Telephone Encounter (Signed)
I received a phone call from the patient this morning at 07:24 AM through the answering service.  Patient stated that she has been struggling with left hip pain for the last 2 weeks.  She went to the [facility] urgent care about 2 weeks ago where she had x-rays of her back and her hip.  Patient stated she was told that she had worsening of osteoarthritis in her hip.  Patient stated that her hip pain did not improve and she went to the emergency room where she was told that she should go to [facility].  She also got prednisone taper from our office which helped her temporarily.  She stated she was seen by Dr. Linna Caprice who recommended physical therapy.  Patient stated she called Dr. Linna Caprice this morning as she was having severe discomfort and pain.  Dr. Linna Caprice recommended getting further studies before they can do an injection for her.  Patient described the pain in her left hip radiates into her left knee.  Patient denied discomfort in her back.  Patient stated that she helped her son left a refrigerator few weeks back but did not feel any immediate pain.  She is interested in getting a trochanteric bursa injection.  Patient stated that her husband has glioblastoma and she has to take him tomorrow at [facility] at 10 AM.  She requested a cortisone injection in the morning.  I advised her that we have no open appointments but I can see her at 7:50AM.  Patient wants to come at 7:50 AM for evaluation and possible trochanteric bursa injection. Pollyann Savoy, MD

## 2023-02-03 ENCOUNTER — Ambulatory Visit: Payer: Federal, State, Local not specified - PPO | Attending: Rheumatology | Admitting: Rheumatology

## 2023-02-03 ENCOUNTER — Encounter: Payer: Self-pay | Admitting: Rheumatology

## 2023-02-03 VITALS — BP 129/74 | HR 84 | Resp 14 | Ht 66.5 in | Wt 129.0 lb

## 2023-02-03 DIAGNOSIS — M7062 Trochanteric bursitis, left hip: Secondary | ICD-10-CM | POA: Diagnosis not present

## 2023-02-03 DIAGNOSIS — M2550 Pain in unspecified joint: Secondary | ICD-10-CM

## 2023-02-03 DIAGNOSIS — M7061 Trochanteric bursitis, right hip: Secondary | ICD-10-CM

## 2023-02-03 DIAGNOSIS — M170 Bilateral primary osteoarthritis of knee: Secondary | ICD-10-CM

## 2023-02-03 DIAGNOSIS — G8929 Other chronic pain: Secondary | ICD-10-CM

## 2023-02-03 DIAGNOSIS — M25571 Pain in right ankle and joints of right foot: Secondary | ICD-10-CM

## 2023-02-03 DIAGNOSIS — F5101 Primary insomnia: Secondary | ICD-10-CM

## 2023-02-03 DIAGNOSIS — M19041 Primary osteoarthritis, right hand: Secondary | ICD-10-CM | POA: Diagnosis not present

## 2023-02-03 DIAGNOSIS — R5383 Other fatigue: Secondary | ICD-10-CM

## 2023-02-03 DIAGNOSIS — C719 Malignant neoplasm of brain, unspecified: Secondary | ICD-10-CM | POA: Diagnosis not present

## 2023-02-03 DIAGNOSIS — M7918 Myalgia, other site: Secondary | ICD-10-CM

## 2023-02-03 DIAGNOSIS — M19042 Primary osteoarthritis, left hand: Secondary | ICD-10-CM

## 2023-02-03 DIAGNOSIS — F419 Anxiety disorder, unspecified: Secondary | ICD-10-CM

## 2023-02-03 DIAGNOSIS — M722 Plantar fascial fibromatosis: Secondary | ICD-10-CM

## 2023-02-03 DIAGNOSIS — Z8669 Personal history of other diseases of the nervous system and sense organs: Secondary | ICD-10-CM

## 2023-02-03 DIAGNOSIS — M25511 Pain in right shoulder: Secondary | ICD-10-CM

## 2023-02-03 DIAGNOSIS — M19071 Primary osteoarthritis, right ankle and foot: Secondary | ICD-10-CM

## 2023-02-03 DIAGNOSIS — M5442 Lumbago with sciatica, left side: Secondary | ICD-10-CM

## 2023-02-03 DIAGNOSIS — M19072 Primary osteoarthritis, left ankle and foot: Secondary | ICD-10-CM

## 2023-02-03 DIAGNOSIS — F32A Depression, unspecified: Secondary | ICD-10-CM

## 2023-02-03 MED ORDER — TRIAMCINOLONE ACETONIDE 40 MG/ML IJ SUSP
60.0000 mg | INTRAMUSCULAR | Status: AC | PRN
  Administered 2023-02-03: 60 mg via INTRA_ARTICULAR

## 2023-02-03 MED ORDER — LIDOCAINE HCL 1 % IJ SOLN
1.5000 mL | INTRAMUSCULAR | Status: AC | PRN
  Administered 2023-02-03: 1.5 mL

## 2023-02-03 NOTE — Progress Notes (Signed)
Office Visit Note  Patient: Jacqueline Sloan             Date of Birth: 04-04-1966           MRN: [id number]             PCP: Willow Ora, MD Referring: Willow Ora, MD Visit Date: 02/03/2023 Occupation: @GUAROCC @  Subjective:  Left hip pain  History of Present Illness: Jacqueline Sloan is a 57 y.o. female with osteoarthritis and myofascial pain syndrome.  She was seen today on an urgent basis for severe pain and discomfort in her left hip.  Patient states that her symptoms started about 2 weeks ago.  She was seen at [facility] urgent care about 2 weeks ago and had x-rays of her back and her left hip.  According the patient she was told that she had osteoarthritis in her hip.  She was seen at the emergency room and then at [facility] by Dr. Linna Caprice.  She was referred to physical therapy.  She states she had no improvement in her symptoms.  She has been having severe discomfort and pain in her left hip which she describes over the trochanteric bursa.  She recalls the only thing she did was helped her son lift a refrigerator few weeks ago but did not experience any discomfort at the time.  She had some discomfort in her left shoulder couple of days ago which is getting better.  Patient states that her husband has an appointment at [facility] this morning and she has to drive for several hours and would like to have an injection in her left trochanteric region    Activities of Daily Living:  Patient reports morning stiffness for 0 minutes.   Patient Reports nocturnal pain.  Difficulty dressing/grooming: Denies Difficulty climbing stairs: Denies Difficulty getting out of chair: Denies Difficulty using hands for taps, buttons, cutlery, and/or writing: Denies  Review of Systems  Constitutional:  Negative for fatigue.  HENT:  Negative for mouth sores and mouth dryness.   Eyes:  Negative for dryness.  Respiratory:  Negative for shortness of breath.   Cardiovascular:  Negative for chest pain  and palpitations.  Gastrointestinal:  Negative for blood in stool, constipation and diarrhea.  Endocrine: Negative for increased urination.  Genitourinary:  Negative for involuntary urination.  Musculoskeletal:  Positive for gait problem, myalgias, muscle tenderness and myalgias. Negative for joint pain, joint pain, joint swelling, muscle weakness and morning stiffness.  Skin:  Negative for color change, rash, hair loss and sensitivity to sunlight.  Allergic/Immunologic: Negative for susceptible to infections.  Neurological:  Negative for dizziness and headaches.  Hematological:  Negative for swollen glands.  Psychiatric/Behavioral:  Positive for depressed mood. Negative for sleep disturbance. The patient is nervous/anxious.     PMFS History:  Patient Active Problem List   Diagnosis Date Noted   Family history of pancreatic cancer 08/17/2021   Major depression, recurrent, chronic (HCC) 06/03/2017   Myofascial pain syndrome 04/18/2016   Primary osteoarthritis of both hands 04/18/2016   Primary osteoarthritis of both feet 04/18/2016   Migraine without aura or status migrainosus 04/18/2016   Primary insomnia 04/18/2016   Seasonal allergic rhinitis due to pollen 12/17/2006    Past Medical History:  Diagnosis Date   ALLERGIC RHINITIS 12/17/2006   ANXIETY 12/17/2006   Depression    GEN OSTEOARTHROSIS INVOLVING MULTIPLE SITES 12/29/2008   Major depression, recurrent, chronic (HCC) 06/03/2017   Managed by Ellis Savage, FNP psych   Migraine  Migraine without aura or status migrainosus 04/18/2016   Preventive with lamictal   Myofascial pain syndrome 04/18/2016   Nausea with vomiting 02/28/2008   Qualifier: Diagnosis of  By: Tawanna Cooler MD, Eugenio Hoes    Osteoarthritis    Other fatigue 04/18/2016   Primary insomnia 04/18/2016   Primary osteoarthritis of both feet 04/18/2016   Primary osteoarthritis of both hands 04/18/2016   Viral URI 02/11/2011    Family History  Problem Relation Age of  Onset   Hypertension Mother    Congestive Heart Failure Mother    Arthritis Mother    Rheum arthritis Maternal Grandmother    Arthritis Maternal Grandfather    Prostate cancer Maternal Grandfather    Healthy Son    Healthy Son    Breast cancer Other    Endometrial cancer Neg Hx    Ovarian cancer Neg Hx    Pancreatic cancer Neg Hx    Colon cancer Neg Hx    Past Surgical History:  Procedure Laterality Date   CESAREAN SECTION  2002   DIAGNOSTIC LAPAROSCOPY     Diagnosed with endometriosis in approximately 2000   TONSILECTOMY, ADENOIDECTOMY, BILATERAL MYRINGOTOMY AND TUBES  2014   uterine tumor removed     Social History   Social History Narrative   Not on file   Immunization History  Administered Date(s) Administered   PFIZER(Purple Top)SARS-COV-2 Vaccination 11/11/2019, 12/04/2019   Td 04/09/1999, 05/03/2010   Tdap 09/11/2022     Objective: Vital Signs: BP 129/74 (BP Location: Left Arm, Patient Position: Sitting, Cuff Size: Normal)   Pulse 84   Resp 14   Ht 5' 6.5" (1.689 m)   Wt 129 lb (58.5 kg)   LMP 08/17/2018   BMI 20.51 kg/m    Physical Exam Vitals and nursing note reviewed.  Constitutional:      Appearance: She is well-developed.  HENT:     Head: Normocephalic and atraumatic.  Eyes:     Conjunctiva/sclera: Conjunctivae normal.  Cardiovascular:     Rate and Rhythm: Normal rate and regular rhythm.     Heart sounds: Normal heart sounds.  Pulmonary:     Effort: Pulmonary effort is normal.     Breath sounds: Normal breath sounds.  Abdominal:     General: Bowel sounds are normal.     Palpations: Abdomen is soft.  Musculoskeletal:     Cervical back: Normal range of motion.  Lymphadenopathy:     Cervical: No cervical adenopathy.  Skin:    General: Skin is warm and dry.     Capillary Refill: Capillary refill takes less than 2 seconds.  Neurological:     Mental Status: She is alert and oriented to person, place, and time.  Psychiatric:         Behavior: Behavior normal.      Musculoskeletal Exam: Cervical, thoracic and lumbar spine with good range of motion.  She has some discomfort in the lower lumbar region.  Shoulder joints, elbow joints, wrist joints, MCPs PIPs and DIPs were in good range of motion with no synovitis.  She has some discomfort with range of motion of her right shoulder joint.  Bilateral PIP and DIP thickening was noted.  Hip joints and knee joints were in good range of motion.  There was no tenderness over ankles or MTPs.  She had tenderness over left trochanteric region.  CDAI Exam: CDAI Score: -- Patient Global: --; Provider Global: -- Swollen: --; Tender: -- Joint Exam 02/03/2023   No joint exam has  been documented for this visit   There is currently no information documented on the homunculus. Go to the Rheumatology activity and complete the homunculus joint exam.  Investigation: No additional findings.  Imaging: No results found.  Recent Labs: Lab Results  Component Value Date   WBC 4.1 09/11/2022   HGB 13.8 09/11/2022   PLT 292.0 09/11/2022   NA 141 09/11/2022   K 3.9 09/11/2022   CL 103 09/11/2022   CO2 24 09/11/2022   GLUCOSE 89 09/11/2022   BUN 12 09/11/2022   CREATININE 0.67 09/11/2022   BILITOT 0.7 09/11/2022   ALKPHOS 73 09/11/2022   AST 19 09/11/2022   ALT 12 09/11/2022   PROT 7.2 09/11/2022   ALBUMIN 4.6 09/11/2022   CALCIUM 9.7 09/11/2022   GFRAA 117 02/06/2018    Speciality Comments: No specialty comments available.  Procedures:  Large Joint Inj: L greater trochanter on 02/03/2023 8:36 AM Indications: pain Details: 27 G 1.5 in needle, lateral approach  Arthrogram: No  Medications: 60 mg triamcinolone acetonide 40 MG/ML; 1.5 mL lidocaine 1 % Aspirate: 0 mL Outcome: tolerated well, no immediate complications Procedure, treatment alternatives, risks and benefits explained, specific risks discussed. Consent was given by the patient. Immediately prior to procedure a  time out was called to verify the correct patient, procedure, equipment, support staff and site/side marked as required. Patient was prepped and draped in the usual sterile fashion.     Allergies: Other   Assessment / Plan:     Visit Diagnoses: Primary osteoarthritis of both hands-she had bilateral PIP and DIP thickening.  She denies any discomfort in her hands.  Joint protection was discussed.  Greater trochanteric bursitis of both hips-she continues to have discomfort in her left trochanteric region.  Patient states she has been having increased discomfort in her left hip as well.  According to the patient she had an evaluation at the [facility] urgent care where she had x-rays of her hip joints and her back.  She states she was told she had some arthritis in her lower back.  She had tenderness over left trochanteric bursa.  Patient requested cortisone injection.  After informed consent was obtained left trochanteric bursa was injected with lidocaine and Kenalog as described above.  Patient tolerated the procedure well.  Postprocedure instructions were given.  A handout on exercises was also given.  I also discussed the possibility is the pain coming into her left thigh could be coming from her back.  If her pain persists she should follow-up with Dr. Linna Caprice.  Primary osteoarthritis of both knees-she had good range of motion of bilateral knee joints without any warmth swelling or effusion.  Acute pain of right shoulder-she has been having some discomfort in right shoulder for last couple of days which is getting better.  I gave her a handout on shoulder joint exercises.  Pain in right ankle and joints of right foot-she has intermittent popping in her right ankle but no discomfort today.  No synovitis was noted.  Primary osteoarthritis of both feet-she has bilateral PIP and DIP thickening.  Proper fitting shoes were advised.  Plantar fasciitis, bilateral-has intermittent plantar fasciitis.  Her  symptoms have improved since she has been wearing proper fitting shoes.  Chronic midline lower back pain with left-sided sciatica-patient states she had x-rays that [facility] urgent care and she was told that she has increased arthritis.  She does not have typical radiculopathy but she notices some discomfort in the anterior aspect of her thigh which  goes into her left foot.  Core strengthening exercises were discussed.  Patient states the x-rays at [facility] urgent care showed some degenerative changes in the lumbar spine.  Advised patient to notify us if her symptoms get worse.  Myofascial pain syndrome-she continues to have some generalized pain and discomfort from myofascial pain.  Stretching exercises were discussed.  Patient is also been under a lot of stress.  Other medical problems are listed as follows:  Hypermobility arthralgia  Other fatigue  Primary insomnia  Anxiety and depression  History of migraine  Orders: Orders Placed This Encounter  Procedures   Large Joint Inj   No orders of the defined types were placed in this encounter.    Follow-Up Instructions: Return for Osteoarthritis.   Pollyann Savoy, MD  Note - This record has been created using Animal nutritionist.  Chart creation errors have been sought, but may not always  have been located. Such creation errors do not reflect on  the standard of medical care.

## 2023-02-03 NOTE — Patient Instructions (Addendum)
Iliotibial Band Syndrome Rehab Ask your health care provider which exercises are safe for you. Do exercises exactly as told by your provider and adjust them as told. It's normal to feel mild stretching, pulling, tightness, or discomfort as you do these exercises. Stop right away if you feel sudden pain or your pain gets a lot worse. Do not begin these exercises until told by your provider. Stretching and range-of-motion exercises These exercises warm up your muscles and joints. They also improve the movement and flexibility of your hip and pelvis. Quadriceps stretch, prone  Lie face down (prone) on a firm surface like a bed or padded floor. Bend your left / right knee. Reach back to hold your ankle or pant leg. If you can't reach your ankle or pant leg, use a belt looped around your foot and grab the belt instead. Gently pull your heel toward your butt. Your knee should not slide out to the side. You should feel a stretch in the front of your thigh and knee, also called the quadriceps. Hold this position for __________ seconds. Repeat __________ times. Complete this exercise __________ times a day. Iliotibial band stretch The iliotibial band is a strip of tissue that runs along the outside of your hip down to your knee. Lie on your side with your left / right leg on top. Bend both knees and grab your left / right ankle. Stretch out your bottom arm to help you balance. Slowly bring your top knee back so your thigh goes behind your back. Slowly lower your top leg toward the floor until you feel a gentle stretch on the outside of your left / right hip and thigh. If you don't feel a stretch and your knee won't go farther, place the heel of your other foot on top of your knee and pull your knee down toward the floor with your foot. Hold this position for __________ seconds. Repeat __________ times. Complete this exercise __________ times a day. Strengthening exercises These exercises build strength  and endurance in your hip and pelvis. Endurance means your muscles can keep working even when they're tired. Straight leg raises, side-lying This exercise strengthens the muscles that rotate the leg at the hip and move it away from your body. These muscles are called hip abductors. Lie on your side with your left / right leg on top. Lie so your head, shoulder, hip, and knee line up. You can bend your bottom knee to help you balance. Roll your hips slightly forward so they're stacked directly over each other. Your left / right knee should face forward. Tense the muscles in your outer thigh and hip. Lift your top leg 4-6 inches (10-15 cm) off the ground. Hold this position for __________ seconds. Slowly lower your leg back down to the starting position. Let your muscles fully relax before doing this exercise again. Repeat __________ times. Complete this exercise __________ times a day. Leg raises, prone This exercise strengthens the muscles that move the hips backward. These muscles are called hip extensors. Lie face down (prone) on your bed or a firm surface. You can put a pillow under your hips for comfort and to support your lower back. Bend your left / right knee so your foot points straight up toward the ceiling. Keep the other leg straight and behind you. Squeeze your butt muscles. Lift your left / right thigh off the firm surface. Do not let your back arch. Tense your thigh muscle as hard as you can without having  more knee pain. Hold this position for __________ seconds. Slowly lower your leg to the starting position. Allow your leg to relax all the way. Repeat __________ times. Complete this exercise __________ times a day. Hip hike  Stand sideways on a bottom step. Place your feet so that your left / right leg is on the step, and the other foot is hanging off the side. If you need support for balance, hold onto a railing or wall. Keep your knees straight and your abdomen square,  meaning your hips are level. Then, lift your left / right hip up toward the ceiling. Slowly let your leg that's hanging off the step lower towards the floor. Your foot should get closer to the ground. Do not lean or bend your knees during this movement. Repeat __________ times. Complete this exercise __________ times a day. This information is not intended to replace advice given to you by your health care provider. Make sure you discuss any questions you have with your health care provider. Document Revised: 06/07/2022 Document Reviewed: 06/07/2022 Elsevier Patient Education  2024 [employer] Shoulder Exercises Ask your health care provider which exercises are safe for you. Do exercises exactly as told by your health care provider and adjust them as directed. It is normal to feel mild stretching, pulling, tightness, or discomfort as you do these exercises. Stop right away if you feel sudden pain or your pain gets worse. Do not begin these exercises until told by your health care provider. Stretching exercises External rotation and abduction This exercise is sometimes called corner stretch. The exercise rotates your arm outward (external rotation) and moves your arm out from your body (abduction). Stand in a doorway with one of your feet slightly in front of the other. This is called a staggered stance. If you cannot reach your forearms to the door frame, stand facing a corner of a room. Choose one of the following positions as told by your health care provider: Place your hands and forearms on the door frame above your head. Place your hands and forearms on the door frame at the height of your head. Place your hands on the door frame at the height of your elbows. Slowly move your weight onto your front foot until you feel a stretch across your chest and in the front of your shoulders. Keep your head and chest upright and keep your abdominal muscles tight. Hold for __________ seconds. To release  the stretch, shift your weight to your back foot. Repeat __________ times. Complete this exercise __________ times a day. Extension, standing  Stand and hold a broomstick, a cane, or a similar object behind your back. Your hands should be a little wider than shoulder-width apart. Your palms should face away from your back. Keeping your elbows straight and your shoulder muscles relaxed, move the stick away from your body until you feel a stretch in your shoulders (extension). Avoid shrugging your shoulders while you move the stick. Keep your shoulder blades tucked down toward the middle of your back. Hold for __________ seconds. Slowly return to the starting position. Repeat __________ times. Complete this exercise __________ times a day. Range-of-motion exercises Pendulum  Stand near a wall or a surface that you can hold onto for balance. Bend at the waist and let your left / right arm hang straight down. Use your other arm to support you. Keep your back straight and do not lock your knees. Relax your left / right arm and shoulder muscles, and move your  hips and your trunk so your left / right arm swings freely. Your arm should swing because of the motion of your body, not because you are using your arm or shoulder muscles. Keep moving your hips and trunk so your arm swings in the following directions, as told by your health care provider: Side to side. Forward and backward. In clockwise and counterclockwise circles. Continue each motion for __________ seconds, or for as long as told by your health care provider. Slowly return to the starting position. Repeat __________ times. Complete this exercise __________ times a day. Shoulder flexion, standing  Stand and hold a broomstick, a cane, or a similar object. Place your hands a little more than shoulder-width apart on the object. Your left / right hand should be palm-up, and your other hand should be palm-down. Keep your elbow straight and  your shoulder muscles relaxed. Push the stick up with your healthy arm to raise your left / right arm in front of your body, and then over your head until you feel a stretch in your shoulder (flexion). Avoid shrugging your shoulder while you raise your arm. Keep your shoulder blade tucked down toward the middle of your back. Hold for __________ seconds. Slowly return to the starting position. Repeat __________ times. Complete this exercise __________ times a day. Shoulder abduction, standing  Stand and hold a broomstick, a cane, or a similar object. Place your hands a little more than shoulder-width apart on the object. Your left / right hand should be palm-up, and your other hand should be palm-down. Keep your elbow straight and your shoulder muscles relaxed. Push the object across your body toward your left / right side. Raise your left / right arm to the side of your body (abduction) until you feel a stretch in your shoulder. Do not raise your arm above shoulder height unless your health care provider tells you to do that. If directed, raise your arm over your head. Avoid shrugging your shoulder while you raise your arm. Keep your shoulder blade tucked down toward the middle of your back. Hold for __________ seconds. Slowly return to the starting position. Repeat __________ times. Complete this exercise __________ times a day. Internal rotation  Place your left / right hand behind your back, palm-up. Use your other hand to dangle an exercise band, a broomstick, or a similar object over your shoulder. Grasp the band with your left / right hand so you are holding on to both ends. Gently pull up on the band until you feel a stretch in the front of your left / right shoulder. The movement of your arm toward the center of your body is called internal rotation. Avoid shrugging your shoulder while you raise your arm. Keep your shoulder blade tucked down toward the middle of your back. Hold for  __________ seconds. Release the stretch by letting go of the band and lowering your hands. Repeat __________ times. Complete this exercise __________ times a day. Strengthening exercises External rotation  Sit in a stable chair without armrests. Secure an exercise band to a stable object at elbow height on your left / right side. Place a soft object, such as a folded towel or a small pillow, between your left / right upper arm and your body to move your elbow about 4 inches (10 cm) away from your side. Hold the end of the exercise band so it is tight and there is no slack. Keeping your elbow pressed against the soft object, slowly move your  forearm out, away from your abdomen (external rotation). Keep your body steady so only your forearm moves. Hold for __________ seconds. Slowly return to the starting position. Repeat __________ times. Complete this exercise __________ times a day. Shoulder abduction  Sit in a stable chair without armrests, or stand up. Hold a __________ lb / kg weight in your left / right hand, or hold an exercise band with both hands. Start with your arms straight down and your left / right palm facing in, toward your body. Slowly lift your left / right hand out to your side (abduction). Do not lift your hand above shoulder height unless your health care provider tells you that this is safe. Keep your arms straight. Avoid shrugging your shoulder while you do this movement. Keep your shoulder blade tucked down toward the middle of your back. Hold for __________ seconds. Slowly lower your arm, and return to the starting position. Repeat __________ times. Complete this exercise __________ times a day. Shoulder extension  Sit in a stable chair without armrests, or stand up. Secure an exercise band to a stable object in front of you so it is at shoulder height. Hold one end of the exercise band in each hand. Straighten your elbows and lift your hands up to shoulder  height. Squeeze your shoulder blades together as you pull your hands down to the sides of your thighs (extension). Stop when your hands are straight down by your sides. Do not let your hands go behind your body. Hold for __________ seconds. Slowly return to the starting position. Repeat __________ times. Complete this exercise __________ times a day. Shoulder row  Sit in a stable chair without armrests, or stand up. Secure an exercise band to a stable object in front of you so it is at chest height. Hold one end of the exercise band in each hand. Position your palms so that your thumbs are facing the ceiling (neutral position). Bend each of your elbows to a 90-degree angle (right angle) and keep your upper arms at your sides. Step back or move the chair back until the band is tight and there is no slack. Slowly pull your elbows back behind you. Hold for __________ seconds. Slowly return to the starting position. Repeat __________ times. Complete this exercise __________ times a day. Shoulder press-ups  Sit in a stable chair that has armrests. Sit upright, with your feet flat on the floor. Put your hands on the armrests so your elbows are bent and your fingers are pointing forward. Your hands should be about even with the sides of your body. Push down on the armrests and use your arms to lift yourself off the chair. Straighten your elbows and lift yourself up as much as you comfortably can. Move your shoulder blades down, and avoid letting your shoulders move up toward your ears. Keep your feet on the ground. As you get stronger, your feet should support less of your body weight as you lift yourself up. Hold for __________ seconds. Slowly lower yourself back into the chair. Repeat __________ times. Complete this exercise __________ times a day. Wall push-ups  Stand so you are facing a stable wall. Your feet should be about one arm-length away from the wall. Lean forward and place your  palms on the wall at shoulder height. Keep your feet flat on the floor as you bend your elbows and lean forward toward the wall. Hold for __________ seconds. Straighten your elbows to push yourself back to the starting position.  Repeat __________ times. Complete this exercise __________ times a day. This information is not intended to replace advice given to you by your health care provider. Make sure you discuss any questions you have with your health care provider. Document Revised: 05/15/2021 Document Reviewed: 05/15/2021 Elsevier Patient Education  2024 [employer].

## 2023-02-04 DIAGNOSIS — G43109 Migraine with aura, not intractable, without status migrainosus: Secondary | ICD-10-CM | POA: Diagnosis not present

## 2023-02-04 DIAGNOSIS — F419 Anxiety disorder, unspecified: Secondary | ICD-10-CM | POA: Diagnosis not present

## 2023-02-11 DIAGNOSIS — M5416 Radiculopathy, lumbar region: Secondary | ICD-10-CM | POA: Diagnosis not present

## 2023-02-11 DIAGNOSIS — D496 Neoplasm of unspecified behavior of brain: Secondary | ICD-10-CM | POA: Diagnosis not present

## 2023-02-18 DIAGNOSIS — M6281 Muscle weakness (generalized): Secondary | ICD-10-CM | POA: Diagnosis not present

## 2023-02-18 DIAGNOSIS — M25552 Pain in left hip: Secondary | ICD-10-CM | POA: Diagnosis not present

## 2023-02-26 DIAGNOSIS — M25552 Pain in left hip: Secondary | ICD-10-CM | POA: Diagnosis not present

## 2023-02-26 DIAGNOSIS — M6281 Muscle weakness (generalized): Secondary | ICD-10-CM | POA: Diagnosis not present

## 2023-03-11 DIAGNOSIS — M25552 Pain in left hip: Secondary | ICD-10-CM | POA: Diagnosis not present

## 2023-03-11 DIAGNOSIS — M6281 Muscle weakness (generalized): Secondary | ICD-10-CM | POA: Diagnosis not present

## 2023-03-13 DIAGNOSIS — Z1231 Encounter for screening mammogram for malignant neoplasm of breast: Secondary | ICD-10-CM | POA: Diagnosis not present

## 2023-03-13 DIAGNOSIS — Z124 Encounter for screening for malignant neoplasm of cervix: Secondary | ICD-10-CM | POA: Diagnosis not present

## 2023-03-13 DIAGNOSIS — Z1331 Encounter for screening for depression: Secondary | ICD-10-CM | POA: Diagnosis not present

## 2023-03-13 DIAGNOSIS — Z01419 Encounter for gynecological examination (general) (routine) without abnormal findings: Secondary | ICD-10-CM | POA: Diagnosis not present

## 2023-06-06 ENCOUNTER — Encounter: Payer: Self-pay | Admitting: Obstetrics and Gynecology

## 2023-06-11 NOTE — Progress Notes (Signed)
 Office Visit Note  Patient: Jacqueline Sloan             Date of Birth: 09/21/1965           MRN: [id number]             PCP: Willow Ora, MD Referring: Willow Ora, MD Visit Date: 06/24/2023 Occupation: @GUAROCC @  Subjective:  Intermittent plantar fasciitis   History of Present Illness: Jacqueline Sloan is a 58 y.o. female with osteoarthritis and myofascial pain syndrome.  She returns today after her last visit in October 2024.  She states her left hip joint discomfort improved after the cortisone injection.  She continues to have some discomfort in the plantar fascia and she has been doing stretching exercises.  She has been also going to the gym on a regular basis.  She states her shoulder joint pain and ankle joint pain has improved.  She is still going through lot of stress due to her husband's health.  Her husband is going through chemotherapy for glioblastoma.    Activities of Daily Living:  Patient reports morning stiffness for 1 hour.   Patient Denies nocturnal pain.  Difficulty dressing/grooming: Denies Difficulty climbing stairs: Denies Difficulty getting out of chair: Denies Difficulty using hands for taps, buttons, cutlery, and/or writing: Denies  Review of Systems  Constitutional:  Positive for fatigue.  HENT:  Negative for mouth sores and mouth dryness.   Eyes:  Positive for dryness.  Respiratory:  Negative for shortness of breath.   Cardiovascular:  Negative for chest pain and palpitations.  Gastrointestinal:  Negative for blood in stool, constipation and diarrhea.  Endocrine: Negative for increased urination.  Genitourinary:  Negative for involuntary urination.  Musculoskeletal:  Positive for joint pain, joint pain and morning stiffness. Negative for gait problem, joint swelling, myalgias, muscle weakness, muscle tenderness and myalgias.  Skin:  Negative for color change, rash, hair loss and sensitivity to sunlight.  Allergic/Immunologic: Negative for  susceptible to infections.  Neurological:  Positive for dizziness and headaches.  Hematological:  Negative for swollen glands.  Psychiatric/Behavioral:  Positive for depressed mood and sleep disturbance. The patient is nervous/anxious.     PMFS History:  Patient Active Problem List   Diagnosis Date Noted   Family history of pancreatic cancer 08/17/2021   Major depression, recurrent, chronic (HCC) 06/03/2017   Myofascial pain syndrome 04/18/2016   Primary osteoarthritis of both hands 04/18/2016   Primary osteoarthritis of both feet 04/18/2016   Migraine without aura or status migrainosus 04/18/2016   Primary insomnia 04/18/2016   Seasonal allergic rhinitis due to pollen 12/17/2006    Past Medical History:  Diagnosis Date   ALLERGIC RHINITIS 12/17/2006   ANXIETY 12/17/2006   Depression    GEN OSTEOARTHROSIS INVOLVING MULTIPLE SITES 12/29/2008   Major depression, recurrent, chronic (HCC) 06/03/2017   Managed by Ellis Savage, FNP psych   Migraine    Migraine without aura or status migrainosus 04/18/2016   Preventive with lamictal   Myofascial pain syndrome 04/18/2016   Nausea with vomiting 02/28/2008   Qualifier: Diagnosis of  By: Tawanna Cooler MD, Tinnie Gens A    Osteoarthritis    Other fatigue 04/18/2016   Primary insomnia 04/18/2016   Primary osteoarthritis of both feet 04/18/2016   Primary osteoarthritis of both hands 04/18/2016   Viral URI 02/11/2011    Family History  Problem Relation Age of Onset   Hypertension Mother    Congestive Heart Failure Mother    Arthritis Mother  Rheum arthritis Maternal Grandmother    Arthritis Maternal Grandfather    Prostate cancer Maternal Grandfather    Healthy Son    Healthy Son    Breast cancer Other    Endometrial cancer Neg Hx    Ovarian cancer Neg Hx    Pancreatic cancer Neg Hx    Colon cancer Neg Hx    Past Surgical History:  Procedure Laterality Date   CESAREAN SECTION  2002   DIAGNOSTIC LAPAROSCOPY     Diagnosed with  endometriosis in approximately 2000   TONSILECTOMY, ADENOIDECTOMY, BILATERAL MYRINGOTOMY AND TUBES  2014   uterine tumor removed     Social History   Social History Narrative   Not on file   Immunization History  Administered Date(s) Administered   PFIZER(Purple Top)SARS-COV-2 Vaccination 11/11/2019, 12/04/2019   Td 04/09/1999, 05/03/2010   Tdap 09/11/2022     Objective: Vital Signs: BP 108/74 (BP Location: Left Arm, Patient Position: Sitting, Cuff Size: Large)   Pulse 73   Resp 14   Ht 5' 6.5" (1.689 m)   Wt 128 lb (58.1 kg)   LMP 08/17/2018   BMI 20.35 kg/m    Physical Exam Vitals and nursing note reviewed.  Constitutional:      Appearance: She is well-developed.  HENT:     Head: Normocephalic and atraumatic.  Eyes:     Conjunctiva/sclera: Conjunctivae normal.  Cardiovascular:     Rate and Rhythm: Normal rate and regular rhythm.     Heart sounds: Normal heart sounds.  Pulmonary:     Effort: Pulmonary effort is normal.     Breath sounds: Normal breath sounds.  Abdominal:     General: Bowel sounds are normal.     Palpations: Abdomen is soft.  Musculoskeletal:     Cervical back: Normal range of motion.  Lymphadenopathy:     Cervical: No cervical adenopathy.  Skin:    General: Skin is warm and dry.     Capillary Refill: Capillary refill takes less than 2 seconds.  Neurological:     Mental Status: She is alert and oriented to person, place, and time.  Psychiatric:        Behavior: Behavior normal.      Musculoskeletal Exam: Cervical, thoracic and lumbar spine 1 good range of motion.  Shoulders, elbows, wrist joints, MCPs PIPs and DIPs in good range of motion with no synovitis.  Hip joints and knee joints in good range of motion without any warmth swelling or effusion.  There was no tenderness over ankles or MTPs.  CDAI Exam: CDAI Score: -- Patient Global: --; Provider Global: -- Swollen: --; Tender: -- Joint Exam 06/24/2023   No joint exam has been  documented for this visit   There is currently no information documented on the homunculus. Go to the Rheumatology activity and complete the homunculus joint exam.  Investigation: No additional findings.  Imaging: No results found.  Recent Labs: Lab Results  Component Value Date   WBC 4.1 09/11/2022   HGB 13.8 09/11/2022   PLT 292.0 09/11/2022   NA 141 09/11/2022   K 3.9 09/11/2022   CL 103 09/11/2022   CO2 24 09/11/2022   GLUCOSE 89 09/11/2022   BUN 12 09/11/2022   CREATININE 0.67 09/11/2022   BILITOT 0.7 09/11/2022   ALKPHOS 73 09/11/2022   AST 19 09/11/2022   ALT 12 09/11/2022   PROT 7.2 09/11/2022   ALBUMIN 4.6 09/11/2022   CALCIUM 9.7 09/11/2022   GFRAA 117 02/06/2018  Speciality Comments: No specialty comments available.  Procedures:  No procedures performed Allergies: Other   Assessment / Plan:     Visit Diagnoses: Primary osteoarthritis of both hands-she lateral PIP and DIP thickening.  No synovitis was noted.  Joint protection was discussed.  Greater trochanteric bursitis of both hips-she had good range of motion of bilateral hips and there was no tenderness over trochanteric region.  She had good response to trochanteric bursa injection at the last visit.  IT band stretches were discussed.  Primary osteoarthritis of both knees-she denies discomfort today.  No warmth swelling or effusion was noted.  Primary osteoarthritis of both feet-proper fitting shoes were advised.  Plantar fasciitis, bilateral-she continues to have intermittent plantar fasciitis.  She has been doing the stretching exercises.  Chronic midline low back pain with left-sided sciatica -she has off-and-on discomfort in her lower back.  A handout on back exercises was given.  Patient states the x-rays at [facility] urgent care showed some degenerative changes in the lumbar spine.  Myofascial pain syndrome-she cannot use to have some myofascial pain.  She has been under a lot of  stress.  Hypermobility arthralgia  Other fatigue  Primary insomnia  Anxiety and depression  History of migraine  Stress-her husband is getting chemotherapy for glioblastoma.  She has the responsibilities of caregiver which has been difficult for her.  Need for regular exercise was emphasized.  Orders: No orders of the defined types were placed in this encounter.  No orders of the defined types were placed in this encounter.    Follow-Up Instructions: Return in about 1 year (around 06/23/2024) for Osteoarthritis.   Pollyann Savoy, MD  Note - This record has been created using Animal nutritionist.  Chart creation errors have been sought, but may not always  have been located. Such creation errors do not reflect on  the standard of medical care.

## 2023-06-24 ENCOUNTER — Encounter: Payer: Self-pay | Admitting: Rheumatology

## 2023-06-24 ENCOUNTER — Ambulatory Visit: Payer: Federal, State, Local not specified - PPO | Attending: Rheumatology | Admitting: Rheumatology

## 2023-06-24 VITALS — BP 108/74 | HR 73 | Resp 14 | Ht 66.5 in | Wt 128.0 lb

## 2023-06-24 DIAGNOSIS — M7061 Trochanteric bursitis, right hip: Secondary | ICD-10-CM

## 2023-06-24 DIAGNOSIS — M170 Bilateral primary osteoarthritis of knee: Secondary | ICD-10-CM | POA: Diagnosis not present

## 2023-06-24 DIAGNOSIS — M25571 Pain in right ankle and joints of right foot: Secondary | ICD-10-CM

## 2023-06-24 DIAGNOSIS — F32A Depression, unspecified: Secondary | ICD-10-CM

## 2023-06-24 DIAGNOSIS — M19071 Primary osteoarthritis, right ankle and foot: Secondary | ICD-10-CM | POA: Diagnosis not present

## 2023-06-24 DIAGNOSIS — Z8669 Personal history of other diseases of the nervous system and sense organs: Secondary | ICD-10-CM

## 2023-06-24 DIAGNOSIS — M5442 Lumbago with sciatica, left side: Secondary | ICD-10-CM

## 2023-06-24 DIAGNOSIS — M19041 Primary osteoarthritis, right hand: Secondary | ICD-10-CM

## 2023-06-24 DIAGNOSIS — R5383 Other fatigue: Secondary | ICD-10-CM

## 2023-06-24 DIAGNOSIS — M19072 Primary osteoarthritis, left ankle and foot: Secondary | ICD-10-CM

## 2023-06-24 DIAGNOSIS — M2550 Pain in unspecified joint: Secondary | ICD-10-CM

## 2023-06-24 DIAGNOSIS — M722 Plantar fascial fibromatosis: Secondary | ICD-10-CM

## 2023-06-24 DIAGNOSIS — F5101 Primary insomnia: Secondary | ICD-10-CM

## 2023-06-24 DIAGNOSIS — F419 Anxiety disorder, unspecified: Secondary | ICD-10-CM

## 2023-06-24 DIAGNOSIS — G8929 Other chronic pain: Secondary | ICD-10-CM

## 2023-06-24 DIAGNOSIS — M25511 Pain in right shoulder: Secondary | ICD-10-CM

## 2023-06-24 DIAGNOSIS — F439 Reaction to severe stress, unspecified: Secondary | ICD-10-CM

## 2023-06-24 DIAGNOSIS — M7918 Myalgia, other site: Secondary | ICD-10-CM

## 2023-06-24 DIAGNOSIS — M7062 Trochanteric bursitis, left hip: Secondary | ICD-10-CM

## 2023-06-24 DIAGNOSIS — M19042 Primary osteoarthritis, left hand: Secondary | ICD-10-CM

## 2023-06-24 NOTE — Patient Instructions (Signed)
 Low Back Sprain or Strain Rehab Ask your health care provider which exercises are safe for you. Do exercises exactly as told by your health care provider and adjust them as directed. It is normal to feel mild stretching, pulling, tightness, or discomfort as you do these exercises. Stop right away if you feel sudden pain or your pain gets worse. Do not begin these exercises until told by your health care provider. Stretching and range-of-motion exercises These exercises warm up your muscles and joints and improve the movement and flexibility of your back. These exercises also help to relieve pain, numbness, and tingling. Lumbar rotation  Lie on your back on a firm bed or the floor with your knees bent. Straighten your arms out to your sides so each arm forms a 90-degree angle (right angle) with a side of your body. Slowly move (rotate) both of your knees to one side of your body until you feel a stretch in your lower back (lumbar). Try not to let your shoulders lift off the floor. Hold this position for __________ seconds. Tense your abdominal muscles and slowly move your knees back to the starting position. Repeat this exercise on the other side of your body. Repeat __________ times. Complete this exercise __________ times a day. Single knee to chest  Lie on your back on a firm bed or the floor with both legs straight. Bend one of your knees. Use your hands to move your knee up toward your chest until you feel a gentle stretch in your lower back and buttock. Hold your leg in this position by holding on to the front of your knee. Keep your other leg as straight as possible. Hold this position for __________ seconds. Slowly return to the starting position. Repeat with your other leg. Repeat __________ times. Complete this exercise __________ times a day. Prone extension on elbows  Lie on your abdomen on a firm bed or the floor (prone position). Prop yourself up on your elbows. Use your arms  to help lift your chest up until you feel a gentle stretch in your abdomen and your lower back. This will place some of your body weight on your elbows. If this is uncomfortable, try stacking pillows under your chest. Your hips should stay down, against the surface that you are lying on. Keep your hip and back muscles relaxed. Hold this position for __________ seconds. Slowly relax your upper body and return to the starting position. Repeat __________ times. Complete this exercise __________ times a day. Strengthening exercises These exercises build strength and endurance in your back. Endurance is the ability to use your muscles for a long time, even after they get tired. Pelvic tilt This exercise strengthens the muscles that lie deep in the abdomen. Lie on your back on a firm bed or the floor with your legs extended. Bend your knees so they are pointing toward the ceiling and your feet are flat on the floor. Tighten your lower abdominal muscles to press your lower back against the floor. This motion will tilt your pelvis so your tailbone points up toward the ceiling instead of pointing to your feet or the floor. To help with this exercise, you may place a small towel under your lower back and try to push your back into the towel. Hold this position for __________ seconds. Let your muscles relax completely before you repeat this exercise. Repeat __________ times. Complete this exercise __________ times a day. Alternating arm and leg raises  Get on your hands  and knees on a firm surface. If you are on a hard floor, you may want to use padding, such as an exercise mat, to cushion your knees. Line up your arms and legs. Your hands should be directly below your shoulders, and your knees should be directly below your hips. Lift your left leg behind you. At the same time, raise your right arm and straighten it in front of you. Do not lift your leg higher than your hip. Do not lift your arm higher  than your shoulder. Keep your abdominal and back muscles tight. Keep your hips facing the ground. Do not arch your back. Keep your balance carefully, and do not hold your breath. Hold this position for __________ seconds. Slowly return to the starting position. Repeat with your right leg and your left arm. Repeat __________ times. Complete this exercise __________ times a day. Abdominal set with straight leg raise  Lie on your back on a firm bed or the floor. Bend one of your knees and keep your other leg straight. Tense your abdominal muscles and lift your straight leg up, 4-6 inches (10-15 cm) off the ground. Keep your abdominal muscles tight and hold this position for __________ seconds. Do not hold your breath. Do not arch your back. Keep it flat against the ground. Keep your abdominal muscles tense as you slowly lower your leg back to the starting position. Repeat with your other leg. Repeat __________ times. Complete this exercise __________ times a day. Single leg lower with bent knees Lie on your back on a firm bed or the floor. Tense your abdominal muscles and lift your feet off the floor, one foot at a time, so your knees and hips are bent in 90-degree angles (right angles). Your knees should be over your hips and your lower legs should be parallel to the floor. Keeping your abdominal muscles tense and your knee bent, slowly lower one of your legs so your toe touches the ground. Lift your leg back up to return to the starting position. Do not hold your breath. Do not let your back arch. Keep your back flat against the ground. Repeat with your other leg. Repeat __________ times. Complete this exercise __________ times a day. Posture and body mechanics Good posture and healthy body mechanics can help to relieve stress in your body's tissues and joints. Body mechanics refers to the movements and positions of your body while you do your daily activities. Posture is part of body  mechanics. Good posture means: Your spine is in its natural S-curve position (neutral). Your shoulders are pulled back slightly. Your head is not tipped forward (neutral). Follow these guidelines to improve your posture and body mechanics in your everyday activities. Standing  When standing, keep your spine neutral and your feet about hip-width apart. Keep a slight bend in your knees. Your ears, shoulders, and hips should line up. When you do a task in which you stand in one place for a long time, place one foot up on a stable object that is 2-4 inches (5-10 cm) high, such as a footstool. This helps keep your spine neutral. Sitting  When sitting, keep your spine neutral and keep your feet flat on the floor. Use a footrest, if necessary, and keep your thighs parallel to the floor. Avoid rounding your shoulders, and avoid tilting your head forward. When working at a desk or a computer, keep your desk at a height where your hands are slightly lower than your elbows. Slide your  chair under your desk so you are close enough to maintain good posture. When working at a computer, place your monitor at a height where you are looking straight ahead and you do not have to tilt your head forward or downward to look at the screen. Resting When lying down and resting, avoid positions that are most painful for you. If you have pain with activities such as sitting, bending, stooping, or squatting, lie in a position in which your body does not bend very much. For example, avoid curling up on your side with your arms and knees near your chest (fetal position). If you have pain with activities such as standing for a long time or reaching with your arms, lie with your spine in a neutral position and bend your knees slightly. Try the following positions: Lying on your side with a pillow between your knees. Lying on your back with a pillow under your knees. Lifting  When lifting objects, keep your feet at least  shoulder-width apart and tighten your abdominal muscles. Bend your knees and hips and keep your spine neutral. It is important to lift using the strength of your legs, not your back. Do not lock your knees straight out. Always ask for help to lift heavy or awkward objects. This information is not intended to replace advice given to you by your health care provider. Make sure you discuss any questions you have with your health care provider. Document Revised: 07/29/2022 Document Reviewed: 06/12/2020 [employer] Patient Education  2024 [employer].

## 2024-06-23 ENCOUNTER — Ambulatory Visit: Admitting: Rheumatology
